# Patient Record
Sex: Female | Born: 1990 | Race: White | Hispanic: No | Smoking: Never smoker
Health system: Southern US, Community
[De-identification: ages and names within clinical notes are randomized; demographics above are authoritative.]

## PROBLEM LIST (undated history)

## (undated) DIAGNOSIS — J353 Hypertrophy of tonsils with hypertrophy of adenoids: Secondary | ICD-10-CM

## (undated) DIAGNOSIS — R29898 Other symptoms and signs involving the musculoskeletal system: Secondary | ICD-10-CM

## (undated) DIAGNOSIS — K8020 Calculus of gallbladder without cholecystitis without obstruction: Secondary | ICD-10-CM

---

## 2012-05-20 ENCOUNTER — Encounter (HOSPITAL_COMMUNITY): Payer: Self-pay | Admitting: *Deleted

## 2012-05-20 ENCOUNTER — Emergency Department (HOSPITAL_COMMUNITY)
Admission: EM | Admit: 2012-05-20 | Discharge: 2012-05-20 | Disposition: A | Discharge status: Home / Self Care | Payer: Federal, State, Local not specified - PPO | Attending: Emergency Medicine | Admitting: Emergency Medicine

## 2012-05-20 ENCOUNTER — Emergency Department (HOSPITAL_COMMUNITY): Payer: Federal, State, Local not specified - PPO

## 2012-05-20 DIAGNOSIS — S4980XA Other specified injuries of shoulder and upper arm, unspecified arm, initial encounter: Secondary | ICD-10-CM | POA: Insufficient documentation

## 2012-05-20 DIAGNOSIS — S46909A Unspecified injury of unspecified muscle, fascia and tendon at shoulder and upper arm level, unspecified arm, initial encounter: Secondary | ICD-10-CM | POA: Insufficient documentation

## 2012-05-20 DIAGNOSIS — Z79899 Other long term (current) drug therapy: Secondary | ICD-10-CM | POA: Insufficient documentation

## 2012-05-20 DIAGNOSIS — T07XXXA Unspecified multiple injuries, initial encounter: Secondary | ICD-10-CM

## 2012-05-20 DIAGNOSIS — Y939 Activity, unspecified: Secondary | ICD-10-CM | POA: Insufficient documentation

## 2012-05-20 DIAGNOSIS — Y92410 Unspecified street and highway as the place of occurrence of the external cause: Secondary | ICD-10-CM | POA: Insufficient documentation

## 2012-05-20 DIAGNOSIS — S298XXA Other specified injuries of thorax, initial encounter: Secondary | ICD-10-CM | POA: Insufficient documentation

## 2012-05-20 MED ORDER — IBUPROFEN 800 MG PO TABS
800.0000 mg | ORAL_TABLET | Freq: Once | ORAL | Status: AC
  Administered 2012-05-20: 800 mg via ORAL
  Filled 2012-05-20: qty 1

## 2012-05-20 NOTE — ED Provider Notes (Signed)
Medical screening examination/treatment/procedure(s) were performed by non-physician practitioner and as supervising physician I was immediately available for consultation/collaboration.  Commodore Bellew, MD 05/20/12 2053 

## 2012-05-20 NOTE — ED Notes (Signed)
Patient with no complaints at this time. Respirations even and unlabored. Skin warm/dry. Discharge instructions reviewed with patient at this time. Patient given opportunity to voice concerns/ask questions. Patient discharged at this time and left Emergency Department with steady gait.   
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
## 2012-09-04 ENCOUNTER — Encounter: Payer: Self-pay | Admitting: *Deleted

## 2012-09-05 ENCOUNTER — Ambulatory Visit (INDEPENDENT_AMBULATORY_CARE_PROVIDER_SITE_OTHER): Payer: BC Managed Care – PPO | Admitting: Family Medicine

## 2012-09-05 ENCOUNTER — Encounter: Payer: Self-pay | Admitting: Family Medicine

## 2012-09-05 VITALS — BP 111/70 | Temp 98.5°F | Wt 131.6 lb

## 2012-09-05 DIAGNOSIS — J019 Acute sinusitis, unspecified: Secondary | ICD-10-CM

## 2012-09-05 MED ORDER — AZITHROMYCIN 250 MG PO TABS: ORAL_TABLET | ORAL | Status: DC

## 2012-09-05 NOTE — Patient Instructions (Signed)
Loratadine 10 mg (generic of Claritin)

## 2012-09-05 NOTE — Progress Notes (Signed)
  Subjective:    Patient ID: Shannon Cole, female    DOB: 05-17-1990, 22 y.o.   MRN: [id number]  HPI Allergies-patient's had allergy symptoms over the past several days head congestion drainage sneezing watery eyes sometimes itching. No wheezing or difficulty breathing. sorethroat-patient relates sore throat over the past several days progressing into sinus pressure and discomfort. Still with some sore throat low-grade fever some chills. Mild sinus headache. Sinus pressure-moderate sinus pressure with allergies using Tylenol for this seems to help a little bit.  Past medical history benign   Review of Systems See above    Objective:   Physical Exam No acute distress, eardrums normal, throat minimal redness, neck is supple some lymph nodes on left side sinus minimal tenderness lungs are clear       Assessment & Plan:  Allergies-loratadine daily Sinusitis acute-Zithromax 5 days, if progressive symptoms or worse call or

## 2012-10-17 ENCOUNTER — Ambulatory Visit (INDEPENDENT_AMBULATORY_CARE_PROVIDER_SITE_OTHER): Payer: BC Managed Care – PPO | Admitting: Family Medicine

## 2012-10-17 ENCOUNTER — Encounter: Payer: Self-pay | Admitting: Family Medicine

## 2012-10-17 VITALS — BP 110/69 | Temp 98.3°F | Wt 133.0 lb

## 2012-10-17 DIAGNOSIS — J029 Acute pharyngitis, unspecified: Secondary | ICD-10-CM

## 2012-10-17 DIAGNOSIS — I889 Nonspecific lymphadenitis, unspecified: Secondary | ICD-10-CM

## 2012-10-17 MED ORDER — CEFPROZIL 500 MG PO TABS: 500.0000 mg | ORAL_TABLET | Freq: Two times a day (BID) | ORAL | Status: DC

## 2012-10-17 NOTE — Progress Notes (Signed)
  Subjective:    Patient ID: Shannon Cole, female    DOB: [birth year], 22 y.o.   MRN: [id number]  Sore Throat  This is a new problem. The current episode started in the past 7 days. The problem has been gradually worsening. The pain is worse on the right side. There has been no fever. The pain is at a severity of 7/10. The pain is moderate. Pertinent negatives include no diarrhea, headaches or hoarse voice. She has tried NSAIDs for the symptoms. The treatment provided no relief.   Increased frequency of throat infections lately   Review of Systems  HENT: Negative for hoarse voice.   Gastrointestinal: Negative for diarrhea.  Neurological: Negative for headaches.       Objective:   Physical Exam  Alert good hydration. HEENT pharynx somewhat erythematous neck tender anterior nodes enlarged right side neck supple lungs clear heart regular in rhythm.  Negative strep screen   No results found for this or any previous visit.  Assessment & Plan:  Impression pharyngitis with lymphadenitis. Discussed. Patient very concerned about recurrent infections. Will pursue ENT consult. WSL

## 2012-10-18 LAB — STREP A DNA PROBE: GASP: NEGATIVE

## 2013-02-12 ENCOUNTER — Encounter (INDEPENDENT_AMBULATORY_CARE_PROVIDER_SITE_OTHER): Payer: Self-pay

## 2013-02-12 ENCOUNTER — Ambulatory Visit (INDEPENDENT_AMBULATORY_CARE_PROVIDER_SITE_OTHER): Payer: BC Managed Care – PPO | Admitting: Adult Health

## 2013-02-12 ENCOUNTER — Encounter: Payer: Self-pay | Admitting: Adult Health

## 2013-02-12 VITALS — BP 110/60 | Ht 64.0 in | Wt 134.0 lb

## 2013-02-12 DIAGNOSIS — Z309 Encounter for contraceptive management, unspecified: Secondary | ICD-10-CM | POA: Insufficient documentation

## 2013-02-12 DIAGNOSIS — Z1212 Encounter for screening for malignant neoplasm of rectum: Secondary | ICD-10-CM

## 2013-02-12 DIAGNOSIS — Z01419 Encounter for gynecological examination (general) (routine) without abnormal findings: Secondary | ICD-10-CM

## 2013-02-12 DIAGNOSIS — N898 Other specified noninflammatory disorders of vagina: Secondary | ICD-10-CM

## 2013-02-12 LAB — POCT WET PREP (WET MOUNT)
Trichomonas Wet Prep HPF POC: NEGATIVE
WBC, Wet Prep HPF POC: NEGATIVE

## 2013-02-12 MED ORDER — LEVONORGEST-ETH ESTRAD 91-DAY 0.15-0.03 &0.01 MG PO TABS: 1.0000 | ORAL_TABLET | Freq: Every day | ORAL | Status: DC

## 2013-02-12 NOTE — Patient Instructions (Signed)
Follow up in 1 year for pap and physical  Call prn 

## 2013-02-12 NOTE — Progress Notes (Signed)
Patient ID: Shannon Cole, female   DOB: 12/20/1990, 22 y.o.   MRN: [id number] History of Present Illness: Shannon Cole is a 22 year old white female single in for physical and complains of discharge after period.   Current Medications, Allergies, Past Medical History, Past Surgical History, Family History and Social History were reviewed in [employer] record.     Review of Systems: Patient denies any headaches, blurred vision, shortness of breath, chest pain, abdominal pain, problems with bowel movements, urination, or intercourse. No joint pains or mood swings, happy with her pills, but has discharge after period.    Physical Exam:BP 110/60  Ht 5\' 4"  (1.626 m)  Wt 134 lb (60.782 kg)  BMI 22.99 kg/m2  LMP 02/03/2013 General:  Well developed, well nourished, no acute distress Skin:  Warm and dry Neck:  Midline trachea, normal thyroid Lungs; Clear to auscultation bilaterally Breast:  No dominant palpable mass, retraction, or nipple discharge Cardiovascular: Regular rate and rhythm Abdomen:  Soft, non tender, no hepatosplenomegaly Pelvic:  External genitalia is normal in appearance.  The vagina is normal in appearance,has white discharge no odor. The cervix is smooth and tiny.  Uterus is felt to be normal size, shape, and contour.  No adnexal masses or tenderness noted. Wet prep was negative Extremities:  No swelling or varicosities noted Psych:  No mood changes, alert and cooperative   Impression: Yearly exam no pap, will pap at 23 Contraceptive management Vaginal discharge    Plan: Refilled seasonique x 1 year Return in 1 year for pap and physical

## 2013-04-11 ENCOUNTER — Telehealth: Payer: Self-pay | Admitting: *Deleted

## 2013-04-11 NOTE — Telephone Encounter (Signed)
Pt missed taking birth control pills on Sunday and Monday of this past week, is bleeding (using tampons) and cramping. Per Cyril Mourning, NP pt to continue birth control and use condoms. Pt to call our office back as needed. Pt verbalized understanding.

## 2013-07-03 ENCOUNTER — Emergency Department (HOSPITAL_COMMUNITY)
Admission: EM | Admit: 2013-07-03 | Discharge: 2013-07-03 | Disposition: A | Discharge status: Home / Self Care | Payer: Federal, State, Local not specified - PPO | Attending: Emergency Medicine | Admitting: Emergency Medicine

## 2013-07-03 ENCOUNTER — Encounter (HOSPITAL_COMMUNITY): Payer: Self-pay | Admitting: Emergency Medicine

## 2013-07-03 DIAGNOSIS — R5381 Other malaise: Secondary | ICD-10-CM | POA: Insufficient documentation

## 2013-07-03 DIAGNOSIS — Z3202 Encounter for pregnancy test, result negative: Secondary | ICD-10-CM | POA: Insufficient documentation

## 2013-07-03 DIAGNOSIS — Z79899 Other long term (current) drug therapy: Secondary | ICD-10-CM | POA: Insufficient documentation

## 2013-07-03 DIAGNOSIS — R5383 Other fatigue: Secondary | ICD-10-CM

## 2013-07-03 DIAGNOSIS — A088 Other specified intestinal infections: Secondary | ICD-10-CM | POA: Insufficient documentation

## 2013-07-03 DIAGNOSIS — A084 Viral intestinal infection, unspecified: Secondary | ICD-10-CM

## 2013-07-03 LAB — CBC WITH DIFFERENTIAL/PLATELET
BASOS PCT: 0 % (ref 0–1)
Basophils Absolute: 0 10*3/uL (ref 0.0–0.1)
EOS ABS: 0 10*3/uL (ref 0.0–0.7)
Eosinophils Relative: 0 % (ref 0–5)
HCT: 40 % (ref 36.0–46.0)
HEMOGLOBIN: 13.7 g/dL (ref 12.0–15.0)
Lymphocytes Relative: 11 % — ABNORMAL LOW (ref 12–46)
Lymphs Abs: 1.2 10*3/uL (ref 0.7–4.0)
MCH: 29.3 pg (ref 26.0–34.0)
MCHC: 34.3 g/dL (ref 30.0–36.0)
MCV: 85.5 fL (ref 78.0–100.0)
MONOS PCT: 7 % (ref 3–12)
Monocytes Absolute: 0.7 10*3/uL (ref 0.1–1.0)
NEUTROS PCT: 82 % — AB (ref 43–77)
Neutro Abs: 9 10*3/uL — ABNORMAL HIGH (ref 1.7–7.7)
PLATELETS: 271 10*3/uL (ref 150–400)
RBC: 4.68 MIL/uL (ref 3.87–5.11)
RDW: 12.4 % (ref 11.5–15.5)
WBC: 10.9 10*3/uL — ABNORMAL HIGH (ref 4.0–10.5)

## 2013-07-03 LAB — URINALYSIS, ROUTINE W REFLEX MICROSCOPIC
Bilirubin Urine: NEGATIVE
GLUCOSE, UA: NEGATIVE mg/dL
Hgb urine dipstick: NEGATIVE
KETONES UR: 15 mg/dL — AB
LEUKOCYTES UA: NEGATIVE
Nitrite: NEGATIVE
PROTEIN: 30 mg/dL — AB
Specific Gravity, Urine: >1.03 — ABNORMAL HIGH (ref 1.005–1.030)
Urobilinogen, UA: 0.2 mg/dL (ref 0.0–1.0)
pH: 5.5 (ref 5.0–8.0)

## 2013-07-03 LAB — COMPREHENSIVE METABOLIC PANEL
ALBUMIN: 3.8 g/dL (ref 3.5–5.2)
ALT: 8 U/L (ref 0–35)
AST: 13 U/L (ref 0–37)
Alkaline Phosphatase: 64 U/L (ref 39–117)
BILIRUBIN TOTAL: 0.4 mg/dL (ref 0.3–1.2)
BUN: 10 mg/dL (ref 6–23)
CHLORIDE: 100 meq/L (ref 96–112)
CO2: 22 meq/L (ref 19–32)
CREATININE: 0.81 mg/dL (ref 0.50–1.10)
Calcium: 8.8 mg/dL (ref 8.4–10.5)
Glucose, Bld: 111 mg/dL — ABNORMAL HIGH (ref 70–99)
Potassium: 3.3 mEq/L — ABNORMAL LOW (ref 3.7–5.3)
Sodium: 136 mEq/L — ABNORMAL LOW (ref 137–147)
Total Protein: 7.8 g/dL (ref 6.0–8.3)

## 2013-07-03 LAB — PREGNANCY, URINE: Preg Test, Ur: NEGATIVE

## 2013-07-03 LAB — URINE MICROSCOPIC-ADD ON

## 2013-07-03 LAB — LIPASE, BLOOD: Lipase: 19 U/L (ref 11–59)

## 2013-07-03 MED ORDER — DICYCLOMINE HCL 10 MG PO CAPS
20.0000 mg | ORAL_CAPSULE | Freq: Once | ORAL | Status: AC
Start: 2013-07-03 — End: 2013-07-03
  Administered 2013-07-03: 20 mg via ORAL
  Filled 2013-07-03: qty 2

## 2013-07-03 MED ORDER — DICYCLOMINE HCL 20 MG PO TABS: 20.0000 mg | ORAL_TABLET | Freq: Two times a day (BID) | ORAL | Status: DC | PRN

## 2013-07-03 MED ORDER — SODIUM CHLORIDE 0.9 % IV BOLUS (SEPSIS)
1000.0000 mL | Freq: Once | INTRAVENOUS | Status: AC
  Administered 2013-07-03: 1000 mL via INTRAVENOUS

## 2013-07-03 MED ORDER — POTASSIUM CHLORIDE CRYS ER 20 MEQ PO TBCR
20.0000 meq | EXTENDED_RELEASE_TABLET | Freq: Once | ORAL | Status: AC
  Administered 2013-07-03: 20 meq via ORAL
  Filled 2013-07-03: qty 1

## 2013-07-03 MED ORDER — ACETAMINOPHEN 500 MG PO TABS
1000.0000 mg | ORAL_TABLET | Freq: Once | ORAL | Status: AC
  Administered 2013-07-03: 1000 mg via ORAL
  Filled 2013-07-03: qty 2

## 2013-07-03 MED ORDER — ONDANSETRON HCL 4 MG/2ML IJ SOLN
4.0000 mg | Freq: Once | INTRAMUSCULAR | Status: AC
  Administered 2013-07-03: 4 mg via INTRAVENOUS
  Filled 2013-07-03: qty 2

## 2013-07-03 MED ORDER — ONDANSETRON HCL 8 MG PO TABS: 8.0000 mg | ORAL_TABLET | Freq: Three times a day (TID) | ORAL | Status: DC | PRN

## 2013-07-03 NOTE — ED Provider Notes (Signed)
Medical screening examination/treatment/procedure(s) were conducted as a shared visit with non-physician practitioner(s) and myself.  I personally evaluated the patient during the encounter.   EKG Interpretation None     Patient is nontender over McBurney's point. She feels better after IV fluids  Shannon HutchingBrian Bartt Gonzaga, MD 07/03/13 1513

## 2013-07-03 NOTE — ED Notes (Signed)
abd pain with n.v.d since yesterday. Fever started last pm per pt 100.9. Sudden onset of severe upper abd pain this am.

## 2013-07-03 NOTE — ED Notes (Signed)
C/o headache. Leona SingletonJ. Idol PA aware.

## 2013-07-03 NOTE — ED Provider Notes (Signed)
CSN: [id number]     Arrival date & time 07/03/13  0747 History   First MD Initiated Contact with Patient 07/03/13 [phone]     Chief Complaint  Patient presents with  . Abdominal Pain  . Vomiting     (Consider location/radiation/quality/duration/timing/severity/associated sxs/prior Treatment) HPI Comments: Shannon Cole is a 23 y.o. Female  With nausea, vomiting and diarrhea along with fever to 100.9 which started yesterday.  She has had generalized abdominal cramping and discomfort until she woke around 4 am today with severe left upper abdominal pain and cramping.  She has not had diarrhea or vomiting today, although she is nauseated and currently has a moderate headache.  She denies hemetemesis and bloody diarrhea.  She has taken tylenol with no improvement in her symptoms.  She has had no po intake today but was able to tolerate a small amount of gingerale and saltine crackers last night before bed.  She denies chest pain, cough and sob, also denies hematuria or dysuria and has no lower abdominal or pelvic cramping.  She has generalized weakness.  Her father had similar symptoms last week.     The history is provided by the patient and a parent.    Past Medical History  Diagnosis Date  . Contraceptive management 02/12/2013   History reviewed. No pertinent past surgical history. Family History  Problem Relation Age of Onset  . Diabetes Mother   . Cancer Father     lung   History  Substance Use Topics  . Smoking status: Never Smoker   . Smokeless tobacco: Never Used  . Alcohol Use: No   OB History   Grav Para Term Preterm Abortions TAB SAB Ect Mult Living                 Review of Systems  Constitutional: Positive for fever and chills.  HENT: Negative for congestion, rhinorrhea and sore throat.   Eyes: Negative.   Respiratory: Negative for chest tightness and shortness of breath.   Cardiovascular: Negative for chest pain.  Gastrointestinal: Positive for nausea, vomiting,  abdominal pain and diarrhea.       Abdominal pain  Genitourinary: Negative.  Negative for dysuria.  Musculoskeletal: Negative for arthralgias, joint swelling and neck pain.  Skin: Negative.  Negative for rash and wound.  Neurological: Negative for dizziness, weakness, light-headedness, numbness and headaches.  Psychiatric/Behavioral: Negative.       Allergies  Review of patient's allergies indicates no known allergies.  Home Medications   Current Outpatient Rx  Name  Route  Sig  Dispense  Refill  . Levonorgestrel-Ethinyl Estradiol (SEASONIQUE) 0.15-0.03 &0.01 MG tablet   Oral   Take 1 tablet by mouth daily.   1 Package   4   . dicyclomine (BENTYL) 20 MG tablet   Oral   Take 1 tablet (20 mg total) by mouth 2 (two) times daily as needed for spasms.   20 tablet   0   . ondansetron (ZOFRAN) 8 MG tablet   Oral   Take 1 tablet (8 mg total) by mouth every 8 (eight) hours as needed for nausea or vomiting.   12 tablet   0    BP 107/62  Pulse 80  Temp(Src) 98.7 F (37.1 C)  Resp 18  Wt 135 lb (61.236 kg)  SpO2 100%  LMP 05/05/2013 Physical Exam  Nursing note and vitals reviewed. Constitutional: She appears well-developed and well-nourished.  HENT:  Head: Normocephalic and atraumatic.  Mouth/Throat: Mucous membranes are dry. No oropharyngeal  exudate or posterior oropharyngeal erythema.  Eyes: Conjunctivae are normal.  Neck: Normal range of motion.  Cardiovascular: Normal rate, regular rhythm, normal heart sounds and intact distal pulses.   Pulmonary/Chest: Effort normal and breath sounds normal. She has no wheezes.  Abdominal: Soft. Bowel sounds are normal. She exhibits no distension and no mass. There is tenderness in the left upper quadrant. There is no guarding.  Musculoskeletal: Normal range of motion.  Neurological: She is alert.  Skin: Skin is warm and dry.  Psychiatric: She has a normal mood and affect.    ED Course  Procedures (including critical care  time) Labs Review Labs Reviewed  CBC WITH DIFFERENTIAL - Abnormal; Notable for the following:    WBC 10.9 (*)    Neutrophils Relative % 82 (*)    Neutro Abs 9.0 (*)    Lymphocytes Relative 11 (*)    All other components within normal limits  URINALYSIS, ROUTINE W REFLEX MICROSCOPIC - Abnormal; Notable for the following:    Specific Gravity, Urine >1.030 (*)    Ketones, ur 15 (*)    Protein, ur 30 (*)    All other components within normal limits  COMPREHENSIVE METABOLIC PANEL - Abnormal; Notable for the following:    Sodium 136 (*)    Potassium 3.3 (*)    Glucose, Bld 111 (*)    All other components within normal limits  URINE MICROSCOPIC-ADD ON - Abnormal; Notable for the following:    Squamous Epithelial / LPF MANY (*)    Bacteria, UA MANY (*)    All other components within normal limits  URINE CULTURE  PREGNANCY, URINE  LIPASE, BLOOD   Imaging Review No results found.   EKG Interpretation None      MDM   Final diagnoses:  Viral gastroenteritis    Pt was given 2 L of normal saline and she also tolerated by mouth fluids and felt better, less weak after receiving these fluids.  She was given replacement of her potassium, given Zofran with resolution of nausea and Bentyl which relieved her abdominal cramping.  She was encouraged to rest, make sure she is drinking plenty of fluids and was given prescriptions for Zofran and Bentyl for home use if her symptoms return.  Discussed with patient the slight risk of early appendicitis and to get rechecked if her pain persists or migrates towards her right lower quadrant.  The patient appears reasonably screened and/or stabilized for discharge and I doubt any other medical condition or other [facility] requiring further screening, evaluation, or treatment in the ED at this time prior to discharge.     Burgess AmorJulie Amayrany Cafaro, PA-C 07/03/13 1110  Burgess AmorJulie Iker Nuttall, PA-C 07/03/13 [phone]

## 2013-07-03 NOTE — Discharge Instructions (Signed)
Viral Gastroenteritis Viral gastroenteritis is also known as stomach flu. This condition affects the stomach and intestinal tract. It can cause sudden diarrhea and vomiting. The illness typically lasts 3 to 8 days. Most people develop an immune response that eventually gets rid of the virus. While this natural response develops, the virus can make you quite ill. CAUSES  Many different viruses can cause gastroenteritis, such as rotavirus or noroviruses. You can catch one of these viruses by consuming contaminated food or water. You may also catch a virus by sharing utensils or other personal items with an infected person or by touching a contaminated surface. SYMPTOMS  The most common symptoms are diarrhea and vomiting. These problems can cause a severe loss of body fluids (dehydration) and a body salt (electrolyte) imbalance. Other symptoms may include:  Fever.  Headache.  Fatigue.  Abdominal pain. DIAGNOSIS  Your caregiver can usually diagnose viral gastroenteritis based on your symptoms and a physical exam. A stool sample may also be taken to test for the presence of viruses or other infections. TREATMENT  This illness typically goes away on its own. Treatments are aimed at rehydration. The most serious cases of viral gastroenteritis involve vomiting so severely that you are not able to keep fluids down. In these cases, fluids must be given through an intravenous line (IV). HOME CARE INSTRUCTIONS   Drink enough fluids to keep your urine clear or pale yellow. Drink small amounts of fluids frequently and increase the amounts as tolerated.  Ask your caregiver for specific rehydration instructions.  Avoid:  Foods high in sugar.  Alcohol.  Carbonated drinks.  Tobacco.  Juice.  Caffeine drinks.  Extremely hot or cold fluids.  Fatty, greasy foods.  Too much intake of anything at one time.  Dairy products until 24 to 48 hours after diarrhea stops.  You may consume probiotics.  Probiotics are active cultures of beneficial bacteria. They may lessen the amount and number of diarrheal stools in adults. Probiotics can be found in yogurt with active cultures and in supplements.  Wash your hands well to avoid spreading the virus.  Only take over-the-counter or prescription medicines for pain, discomfort, or fever as directed by your caregiver. Do not give aspirin to children. Antidiarrheal medicines are not recommended.  Ask your caregiver if you should continue to take your regular prescribed and over-the-counter medicines.  Keep all follow-up appointments as directed by your caregiver. SEEK IMMEDIATE MEDICAL CARE IF:   You are unable to keep fluids down.  You do not urinate at least once every 6 to 8 hours.  You develop shortness of breath.  You notice blood in your stool or vomit. This may look like coffee grounds.  You have abdominal pain that increases or is concentrated in one small area (localized).  You have persistent vomiting or diarrhea.  You have a fever.  The patient is a child younger than 3 months, and he or she has a fever.  The patient is a child older than 3 months, and he or she has a fever and persistent symptoms.  The patient is a child older than 3 months, and he or she has a fever and symptoms suddenly get worse.  The patient is a baby, and he or she has no tears when crying. MAKE SURE YOU:   Understand these instructions.  Will watch your condition.  Will get help right away if you are not doing well or get worse. Document Released: 04/18/2005 Document Revised: 07/11/2011 Document Reviewed: 02/02/2011   [employer] Patient Information 2014 [employer]  

## 2013-07-03 NOTE — ED Notes (Signed)
nad noted prior to dc. Dc instructions reviewed with pt. 2 scripts given to pt.

## 2013-07-04 LAB — URINE CULTURE: Colony Count: 50000

## 2013-12-19 ENCOUNTER — Ambulatory Visit (INDEPENDENT_AMBULATORY_CARE_PROVIDER_SITE_OTHER): Payer: BC Managed Care – PPO | Admitting: Family Medicine

## 2013-12-19 VITALS — BP 104/62 | HR 71 | Temp 97.9°F | Resp 16 | Ht 64.0 in | Wt 132.6 lb

## 2013-12-19 DIAGNOSIS — L03115 Cellulitis of right lower limb: Secondary | ICD-10-CM

## 2013-12-19 DIAGNOSIS — L03119 Cellulitis of unspecified part of limb: Secondary | ICD-10-CM

## 2013-12-19 DIAGNOSIS — L02419 Cutaneous abscess of limb, unspecified: Secondary | ICD-10-CM

## 2013-12-19 MED ORDER — DOXYCYCLINE HYCLATE 100 MG PO CAPS: 100.0000 mg | ORAL_CAPSULE | Freq: Two times a day (BID) | ORAL | Status: DC

## 2013-12-19 NOTE — Progress Notes (Signed)
   Subjective:    Patient ID: Shannon Cole, female    DOB: 05/02/1990, 23 y.o.   MRN: [id number]  HPI Shannon LeatherwoodKatherine is a 23 year old female who presents with a bug bite today. Onset was 3 days ago. Location is the right anterior shin. She cannot recall witnessing a specific bug bite. She says that the initial lites started out as a small approximately half centimeter we'll with mild erythema that was itchy. As she continued to itch this, today it became increasingly red and the surrounding area with slight tenderness. She has not tried any relieving factors. No recent camping, tick exposure, going into the grass or woods.  She states she is currently taking her OCPs diligently, and does not miss any doses. Allergies: NKDA  History   Social History  . Marital Status: Single    Spouse Name: N/A    Number of Children: N/A  . Years of Education: N/A   Occupational History  . Not on file.   Social History Main Topics  . Smoking status: Never Smoker   . Smokeless tobacco: Never Used  . Alcohol Use: No  . Drug Use: No  . Sexual Activity: Yes    Birth Control/ Protection: Pill   Other Topics Concern  . Not on file   Social History Narrative  . No narrative on file    Review of Systems Pertinent Postive: Rash, itching Pertinent negative: fevers, chills, nausea, vomiting, malaise.    Objective:   Physical Exam BP 104/62  Pulse 71  Temp(Src) 97.9 F (36.6 C) (Oral)  Resp 16  Ht 5\' 4"  (1.626 m)  Wt 132 lb 9.6 oz (60.147 kg)  BMI 22.75 kg/m2  SpO2 100%  LMP 11/18/2013 General appearance: alert, cooperative and appears stated age Skin: Skin color, texture, turgor normal. No rashes or lesions or 3x3 circular macular area of erythema at the right anterior shin, with 5 mm central area of induration without fluctuance. Small punctate lesion seen at the Center for this. The surrounding erythema is salmon colored, not beefy red. No central clearing of the rash. Lymph nodes: Cervical,  supraclavicular, and axillary nodes normal. and No popliteal or inguinal LAD      Assessment & Plan:  1. Cellulitis, most likely from bug bite with resulting itching. Differential diagnosis also includes, but is less likely Lyme's disease arising onset fever, given the lack of systemic symptoms, or characteristic erythema migrans. No other MRSA risk factors.  -Regardless, we will start her on doxycycline 100 mg twice a day x10 days for the cellulitis.  -I cautioned her about the teratogenic effects of doxycycline on a newborn fetuses. She again reinforced that she is compliant with her oral contraceptive, Seasonique. Instructed her to use a backup form of birth control as well, such as condom use for abstinence while she is on doxycycline. She verbalized understanding and agreement. Rx sent to pharmacy. -For itching, I recommended hydrocortisone 1% cream as needed to the area. -I cautioned her about sun exposure while taking doxycycline, with the risk of photosensitive rash. She verbalized understanding and agreement. -He will followup as needed. If she develops any worsening rash, fevers, chills, malaise, nausea, vomiting, or for any other concerns she should follow up with her clinic or go to the emergency department.  Dr. Tora KindredJohn Pick-Jacobs, DO Sports Medicine Fellow  Discussed with Dr. Katrinka BlazingSmith, M.D.

## 2013-12-19 NOTE — Patient Instructions (Addendum)

## 2013-12-20 NOTE — Addendum Note (Signed)
Addended by: Ethelda ChickSMITH, Jaskirat Schwieger M on: 12/20/2013 09:33 AM   Modules accepted: Level of Service

## 2013-12-20 NOTE — Progress Notes (Signed)
History and physical examinations reviewed in detail with Dr. Joellyn HaffPick-Jacobs.  Agree with assessment and plan.
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
## 2014-02-25 ENCOUNTER — Ambulatory Visit (INDEPENDENT_AMBULATORY_CARE_PROVIDER_SITE_OTHER): Payer: BC Managed Care – PPO | Admitting: Physician Assistant

## 2014-02-25 VITALS — BP 115/71 | HR 103 | Temp 100.4°F | Resp 20 | Ht 65.0 in | Wt 132.2 lb

## 2014-02-25 DIAGNOSIS — J029 Acute pharyngitis, unspecified: Secondary | ICD-10-CM

## 2014-02-25 DIAGNOSIS — J0391 Acute recurrent tonsillitis, unspecified: Secondary | ICD-10-CM

## 2014-02-25 LAB — POCT RAPID STREP A (OFFICE): Rapid Strep A Screen: NEGATIVE

## 2014-02-25 MED ORDER — AMOXICILLIN-POT CLAVULANATE 875-125 MG PO TABS: 1.0000 | ORAL_TABLET | Freq: Two times a day (BID) | ORAL | Status: DC

## 2014-02-25 NOTE — Progress Notes (Signed)
   Subjective:    Patient ID: Shannon Cole, female    DOB: 03/20/1991, 23 y.o.   MRN: [id number]  HPI  This is a 23 year old female with no significant PMH here with 3 days of sore throat and lymphadenopathy. She has one painful swollen node on the right side of her neck. She denies associated symptoms - no nasal congestion, otalgia, or cough. She is febrile today. She tried tylenol last night with some relief. She reports she has gotten sick once a month for many years and always gets a sore throat with her illnesses. She wants her tonsils removed.  Review of Systems  Constitutional: Positive for fever. Negative for chills.  HENT: Positive for sore throat. Negative for congestion, ear pain, sinus pressure and trouble swallowing.   Eyes: Negative for discharge and redness.  Respiratory: Negative for cough.   Gastrointestinal: Negative for abdominal pain.  Skin: Negative for rash.       Objective:   Physical Exam  Constitutional: She is oriented to person, place, and time. She appears well-developed and well-nourished. No distress.  HENT:  Head: Normocephalic and atraumatic.  Right Ear: Hearing, tympanic membrane, external ear and ear canal normal.  Left Ear: Hearing, tympanic membrane, external ear and ear canal normal.  Nose: Nose normal.  Mouth/Throat: Uvula is midline and mucous membranes are normal. No uvula swelling. Oropharyngeal exudate and posterior oropharyngeal erythema present. No tonsillar abscesses.  Eyes: Conjunctivae are normal. Right eye exhibits no discharge. Left eye exhibits no discharge. No scleral icterus.  Lymphadenopathy:    She has cervical adenopathy.       Right cervical: Superficial cervical (2cm node, tender) adenopathy present.  Neurological: She is alert and oriented to person, place, and time.  Skin: Skin is warm, dry and intact. No lesion and no rash noted.  Psychiatric: She has a normal mood and affect. Her speech is normal and behavior is normal.  Thought content normal.   Results for orders placed in visit on 02/25/14  POCT RAPID STREP A (OFFICE)      Result Value Ref Range   Rapid Strep A Screen Negative  Negative      Assessment & Plan:  1. Sore throat 2. Acute pharyngitis, unspecified pharyngitis type 3. Recurrent tonsillitis  Rapid strep was negative, however chronic infection is likely d/t recurrent pharyngitis. Will treat empirically with Augmentin. Culture pending. Made a referral to ENT to discuss tonsillectomy.  - POCT rapid strep A - Culture, Group A Strep - amoxicillin-clavulanate (AUGMENTIN) 875-125 MG per tablet; Take 1 tablet by mouth 2 (two) times daily.  Dispense: 20 tablet; Refill: 0 - Ambulatory referral to ENT   Roswell MinersNicole V. Dyke BrackettBush, PA-C, MHS [facility]  02/25/2014

## 2014-02-25 NOTE — Patient Instructions (Signed)
You will be contacted to make an appt. With ENT. Take augmentin twice a day for 10 days. Return to clinic in 7-10 days if not improved.

## 2014-02-27 ENCOUNTER — Telehealth: Payer: Self-pay | Admitting: Physician Assistant

## 2014-02-27 ENCOUNTER — Telehealth: Payer: Self-pay

## 2014-02-27 DIAGNOSIS — J029 Acute pharyngitis, unspecified: Secondary | ICD-10-CM

## 2014-02-27 MED ORDER — AZITHROMYCIN 250 MG PO TABS: ORAL_TABLET | ORAL | Status: DC

## 2014-02-27 NOTE — Telephone Encounter (Signed)
Patient thinks she is having a reaction to Medication.  She wants to talk with Joni ReiningNicole.  Please have Joni Reiningicole call patient when she comes in at 3 pm.    [phone]

## 2014-02-27 NOTE — Telephone Encounter (Signed)
Talked with patient. She woke this morning with 5-6 red, pruritic tender spots on her forearms. She is not having any oral edema or difficulty breathing. She only took two pills yesterday and none today. She cannot recall an allergy to amoxicillin or augmentin before. She reports her PCP usually gives her zithromax for her sore throat. She will stop the augmentin and start zithromax. She will take benadryl for the rash. She will call with any further concerns.

## 2014-02-27 NOTE — Telephone Encounter (Signed)
Spoke to pt- she called her PCP she needed an answer right away. She has stopped taking abx. Please advise if a new medication needs to be ordered. She has broke out in hives. She is itchy and the red areas hurt. Advised pt to take some benadryl. Denies any other symptoms. No SOB or difficulty breathing.

## 2014-02-28 ENCOUNTER — Encounter: Payer: Self-pay | Admitting: Nurse Practitioner

## 2014-02-28 ENCOUNTER — Ambulatory Visit (INDEPENDENT_AMBULATORY_CARE_PROVIDER_SITE_OTHER): Payer: BC Managed Care – PPO | Admitting: Nurse Practitioner

## 2014-02-28 VITALS — BP 122/72 | Temp 98.4°F | Ht 65.0 in | Wt 132.4 lb

## 2014-02-28 DIAGNOSIS — J029 Acute pharyngitis, unspecified: Secondary | ICD-10-CM

## 2014-02-28 DIAGNOSIS — J0391 Acute recurrent tonsillitis, unspecified: Secondary | ICD-10-CM

## 2014-02-28 LAB — CULTURE, GROUP A STREP: ORGANISM ID, BACTERIA: NORMAL

## 2014-02-28 NOTE — Progress Notes (Signed)
I was directly involved with the patient's care and agree with the physical, diagnosis and treatment plan.  

## 2014-03-05 ENCOUNTER — Encounter: Payer: Self-pay | Admitting: Nurse Practitioner

## 2014-03-05 NOTE — Progress Notes (Signed)
Subjective:  Presents for recheck on her sore throat. Was seen in urgent care on 10/27 for acute pharyngitis and recurrent tonsillitis. Rapid strep was negative. Started on Augmentin. Recommended referral to ENT specialist for evaluation.she had a rash, on her forearms after starting Augmentin, she was advised by PA to stop medication and switched to Zithromax. Having sore throat about once a month. Denies any snoring or difficulty swallowing. Sore throat is slightly better at this point. No fever. Continues to have a swollen lymph node on the right side. No further rash.  Objective:   BP 122/72 mmHg  Temp(Src) 98.4 F (36.9 C) (Oral)  Ht 5\' 5"  (1.651 m)  Wt 132 lb 6 oz (60.045 kg)  BMI 22.03 kg/m2  LMP 02/03/2014 NAD. Alert, oriented. TMs mild clear effusion, no erythema. Pharynx minimal erythema on the posterior soft palate, left tonsil normal, right tonsil 1+ with no erythema or exudate noted. Neck supple with moderate soft tender right anterior cervical adenopathy. Lungs clear. Heart regular rate rhythm. Abdomen soft nondistended nontender.  Assessment: Acute pharyngitis, unspecified pharyngitis type - Plan: CBC with Differential, Mononucleosis screen  Plan: complete Z-Pak as directed. Labs ordered as a precaution due to persistent sore throat. Warning signs reviewed. Will refer to ENT specialist per patient request. Call back sooner if no improvement.

## 2014-03-25 ENCOUNTER — Other Ambulatory Visit: Payer: BC Managed Care – PPO | Admitting: Adult Health

## 2014-04-22 ENCOUNTER — Encounter: Payer: Self-pay | Admitting: Nurse Practitioner

## 2014-04-22 ENCOUNTER — Ambulatory Visit (INDEPENDENT_AMBULATORY_CARE_PROVIDER_SITE_OTHER): Payer: BC Managed Care – PPO | Admitting: Nurse Practitioner

## 2014-04-22 ENCOUNTER — Encounter: Payer: Self-pay | Admitting: Family Medicine

## 2014-04-22 VITALS — Temp 98.5°F | Ht 65.0 in | Wt 133.0 lb

## 2014-04-22 DIAGNOSIS — A084 Viral intestinal infection, unspecified: Secondary | ICD-10-CM

## 2014-04-22 MED ORDER — ONDANSETRON 8 MG PO TBDP: 8.0000 mg | ORAL_TABLET | Freq: Three times a day (TID) | ORAL | Status: DC | PRN

## 2014-04-26 ENCOUNTER — Encounter: Payer: Self-pay | Admitting: Nurse Practitioner

## 2014-04-26 NOTE — Progress Notes (Signed)
Subjective: Presents for complaints of nausea vomiting and diarrhea that began 3 days ago. Vomiting has stopped, none today. Slight nausea. 3 episodes of diarrhea today. No fever. Mild abdominal pain. Taking clear liquids well. No urinary symptoms.  Objective:   Temp(Src) 98.5 F (36.9 C)  Ht 5\' 5"  (1.651 m)  Wt 133 lb (60.328 kg)  BMI 22.13 kg/m2 NAD. Alert, oriented. TMs normal limit. Pharynx clear moist. Neck supple with minimal adenopathy. Lungs clear. Heart regular rate rhythm. Abdomen soft nondistended with active bowel sounds; mild epigastric area tenderness, no rebound or guarding.  Assessment: Viral gastroenteritis  Plan:  Meds ordered this encounter  Medications  . ondansetron (ZOFRAN-ODT) 8 MG disintegrating tablet    Sig: Take 1 tablet (8 mg total) by mouth every 8 (eight) hours as needed for nausea or vomiting.    Dispense:  20 tablet    Refill:  0    Order Specific Question:  Supervising Provider    Answer:  Merlyn AlbertLUKING, WILLIAM S [[id number]]   Reviewed symptomatic care dietary measures and warning signs. Call back in a.m. if no improvement, go to ED sooner if worse.

## 2014-05-01 ENCOUNTER — Other Ambulatory Visit: Payer: Self-pay | Admitting: Adult Health

## 2014-05-06 ENCOUNTER — Encounter: Payer: Self-pay | Admitting: Adult Health

## 2014-05-06 ENCOUNTER — Other Ambulatory Visit (HOSPITAL_COMMUNITY)
Admission: RE | Admit: 2014-05-06 | Discharge: 2014-05-06 | Disposition: A | Discharge status: Home / Self Care | Payer: Federal, State, Local not specified - PPO | Source: Ambulatory Visit | Attending: Obstetrics and Gynecology | Admitting: Obstetrics and Gynecology

## 2014-05-06 ENCOUNTER — Ambulatory Visit (INDEPENDENT_AMBULATORY_CARE_PROVIDER_SITE_OTHER): Payer: BLUE CROSS/BLUE SHIELD | Admitting: Adult Health

## 2014-05-06 VITALS — BP 110/60 | HR 74 | Ht 64.0 in | Wt 131.5 lb

## 2014-05-06 DIAGNOSIS — Z01419 Encounter for gynecological examination (general) (routine) without abnormal findings: Secondary | ICD-10-CM | POA: Diagnosis not present

## 2014-05-06 DIAGNOSIS — Z3041 Encounter for surveillance of contraceptive pills: Secondary | ICD-10-CM

## 2014-05-06 MED ORDER — LEVONORGEST-ETH ESTRAD 91-DAY 0.15-0.03 &0.01 MG PO TABS
1.0000 | ORAL_TABLET | Freq: Every day | ORAL | Status: DC
Start: 2014-05-06 — End: 2015-07-31

## 2014-05-06 NOTE — Patient Instructions (Signed)
Physical in 1 year Pap in 2 years  

## 2014-05-06 NOTE — Progress Notes (Signed)
Patient ID: Shannon PittsKatherine Cole, female   DOB: 05/03/1990, 24 y.o.   MRN: [id number] History of Present Illness: Shannon Cole is a 24 year old white female in for pap and physical.She is on camrese but is thinking about changing methods, so she does not have to remember to take a pill every daily.No complaints.   Current Medications, Allergies, Past Medical History, Past Surgical History, Family History and Social History were reviewed in [facility] electronic [facility] record.     Review of Systems: Patient denies any headaches, blurred vision, shortness of breath, chest pain, abdominal pain, problems with bowel movements, urination, or intercourse. No joint pain or mood swings.    Physical Exam:BP 110/60 mmHg  Pulse 74  Ht 5\' 4"  (1.626 m)  Wt 131 lb 8 oz (59.648 kg)  BMI 22.56 kg/m2  LMP 05/06/2014 General:  Well developed, well nourished, no acute distress Skin:  Warm and dry Neck:  Midline trachea, normal thyroid Lungs; Clear to auscultation bilaterally Breast:  No dominant palpable mass, retraction, or nipple discharge Cardiovascular: Regular rate and rhythm Abdomen:  Soft, non tender, no hepatosplenomegaly Pelvic:  External genitalia is normal in appearance.  The vagina is normal in appearance, has some white discharge,some pink too, like period starting, no odor.  The cervix is tiny, pap peformed.  Uterus is felt to be normal size, shape, and contour.  No  adnexal masses or tenderness noted. Extremities:  No swelling or varicosities noted Psych:  No mood changes,alert and cooperative,seems happy Discussed nexplanon, skyla and nuva ring, will review all the handouts and think about it, will stay on OCs for now, told her to put with make up and set alarm on phone to help remind her to take the pill.  Impression: Well woman gyn exam with pap Contraceptive management   Plan: Refilled camrese x 1 year Review handouts on nexplanon,skyla and nuva ring,call if wants to try one of  these Physical in 1 year,pap in 2 if normal today

## 2014-05-07 LAB — CYTOLOGY - PAP

## 2014-07-25 ENCOUNTER — Encounter: Payer: Self-pay | Admitting: Family Medicine

## 2014-07-25 ENCOUNTER — Ambulatory Visit (INDEPENDENT_AMBULATORY_CARE_PROVIDER_SITE_OTHER): Payer: Federal, State, Local not specified - PPO | Admitting: Family Medicine

## 2014-07-25 VITALS — BP 102/70 | Temp 99.1°F | Ht 64.0 in | Wt 133.0 lb

## 2014-07-25 DIAGNOSIS — J019 Acute sinusitis, unspecified: Secondary | ICD-10-CM

## 2014-07-25 DIAGNOSIS — B9689 Other specified bacterial agents as the cause of diseases classified elsewhere: Secondary | ICD-10-CM

## 2014-07-25 MED ORDER — AZITHROMYCIN 250 MG PO TABS: ORAL_TABLET | ORAL | Status: DC

## 2014-07-25 NOTE — Progress Notes (Signed)
   Subjective:    Patient ID: Shannon PittsKatherine Cole, female    DOB: 03/30/1991, 24 y.o.   MRN: [id number]  Cough This is a new problem. Episode onset: 1 week ago. Associated symptoms include nasal congestion and rhinorrhea. Pertinent negatives include no chest pain, ear pain, fever, shortness of breath or wheezing. Treatments tried: coricidin, ibuprofen.   PMH benign   Review of Systems  Constitutional: Negative for fever and activity change.  HENT: Positive for congestion and rhinorrhea. Negative for ear pain.   Eyes: Negative for discharge.  Respiratory: Positive for cough. Negative for shortness of breath and wheezing.   Cardiovascular: Negative for chest pain.       Objective:   Physical Exam  Constitutional: She appears well-developed.  HENT:  Head: Normocephalic.  Nose: Nose normal.  Mouth/Throat: Oropharynx is clear and moist. No oropharyngeal exudate.  Neck: Neck supple.  Cardiovascular: Normal rate and normal heart sounds.   No murmur heard. Pulmonary/Chest: Effort normal and breath sounds normal. She has no wheezes.  Lymphadenopathy:    She has no cervical adenopathy.  Skin: Skin is warm and dry.  Nursing note and vitals reviewed.         Assessment & Plan:  Acute rhinosinusitis antibiotics prescribed warning signs discussed follow-up if ongoing troubles

## 2014-08-23 ENCOUNTER — Emergency Department (HOSPITAL_COMMUNITY)
Admission: EM | Admit: 2014-08-23 | Discharge: 2014-08-23 | Disposition: A | Discharge status: Home / Self Care | Payer: Federal, State, Local not specified - PPO | Attending: Emergency Medicine | Admitting: Emergency Medicine

## 2014-08-23 ENCOUNTER — Encounter (HOSPITAL_COMMUNITY): Payer: Self-pay | Admitting: Emergency Medicine

## 2014-08-23 DIAGNOSIS — J029 Acute pharyngitis, unspecified: Secondary | ICD-10-CM | POA: Diagnosis present

## 2014-08-23 DIAGNOSIS — Z79899 Other long term (current) drug therapy: Secondary | ICD-10-CM | POA: Diagnosis not present

## 2014-08-23 DIAGNOSIS — Z792 Long term (current) use of antibiotics: Secondary | ICD-10-CM | POA: Diagnosis not present

## 2014-08-23 DIAGNOSIS — J020 Streptococcal pharyngitis: Secondary | ICD-10-CM | POA: Insufficient documentation

## 2014-08-23 LAB — RAPID STREP SCREEN (MED CTR MEBANE ONLY): Streptococcus, Group A Screen (Direct): NEGATIVE

## 2014-08-23 MED ORDER — AZITHROMYCIN 250 MG PO TABS
500.0000 mg | ORAL_TABLET | Freq: Once | ORAL | Status: AC
  Administered 2014-08-23: 500 mg via ORAL
  Filled 2014-08-23: qty 2

## 2014-08-23 MED ORDER — AZITHROMYCIN 500 MG PO TABS: 500.0000 mg | ORAL_TABLET | Freq: Every day | ORAL | Status: DC

## 2014-08-23 MED ORDER — KETOROLAC TROMETHAMINE 30 MG/ML IJ SOLN
30.0000 mg | Freq: Once | INTRAMUSCULAR | Status: AC
  Administered 2014-08-23: 30 mg via INTRAVENOUS
  Filled 2014-08-23: qty 1

## 2014-08-23 MED ORDER — SODIUM CHLORIDE 0.9 % IV BOLUS (SEPSIS)
1000.0000 mL | INTRAVENOUS | Status: AC
  Administered 2014-08-23: 1000 mL via INTRAVENOUS

## 2014-08-23 MED ORDER — ACETAMINOPHEN 325 MG PO TABS
ORAL_TABLET | ORAL | Status: AC
  Filled 2014-08-23: qty 2

## 2014-08-23 MED ORDER — ONDANSETRON HCL 4 MG PO TABS: 4.0000 mg | ORAL_TABLET | Freq: Four times a day (QID) | ORAL | Status: DC

## 2014-08-23 MED ORDER — ONDANSETRON HCL 4 MG/2ML IJ SOLN
4.0000 mg | INTRAMUSCULAR | Status: AC
  Administered 2014-08-23: 4 mg via INTRAVENOUS
  Filled 2014-08-23: qty 2

## 2014-08-23 MED ORDER — HYDROCODONE-ACETAMINOPHEN 5-325 MG PO TABS: 1.0000 | ORAL_TABLET | ORAL | Status: DC | PRN

## 2014-08-23 MED ORDER — ACETAMINOPHEN 325 MG PO TABS
650.0000 mg | ORAL_TABLET | Freq: Once | ORAL | Status: AC
  Administered 2014-08-23: 650 mg via ORAL

## 2014-08-23 MED ORDER — PREDNISONE 20 MG PO TABS: 40.0000 mg | ORAL_TABLET | Freq: Every day | ORAL | Status: DC

## 2014-08-23 MED ORDER — DEXAMETHASONE SODIUM PHOSPHATE 4 MG/ML IJ SOLN
4.0000 mg | Freq: Once | INTRAMUSCULAR | Status: AC
  Administered 2014-08-23: 4 mg via INTRAVENOUS
  Filled 2014-08-23: qty 1

## 2014-08-23 NOTE — ED Notes (Signed)
Pt reports sore throat x 2 days with right sided throat swelling, right sided tonsil swelling; pt reports only PO water tolerated;

## 2014-08-23 NOTE — Discharge Instructions (Signed)
Please follow the directions provided.  Be sure to follow up with your primary care provider to ensure you are getting better.  Take the azithromycin daily until it is all gone.  Take the prednisone daily until it is all gone.  Take the zofran as needed for nausea.  Take the vicodin as needed for pain.  Be sure to drink plenty of water to stay well hydrated.  Don't hesitate to return for any new, worsening or concerning symptoms.     SEEK IMMEDIATE MEDICAL CARE IF:  You develop any new symptoms such as vomiting, severe headache, stiff or painful neck, chest pain, shortness of breath, or trouble swallowing.  You develop severe throat pain, drooling, or changes in your voice.  You develop swelling of the neck, or the skin on the neck becomes red and tender.  You develop signs of dehydration, such as fatigue, dry mouth, and decreased urination.  You become increasingly sleepy, or you cannot wake up completely.

## 2014-08-23 NOTE — ED Provider Notes (Signed)
CSN: [id number]     Arrival date & time 08/23/14  1914 History   First MD Initiated Contact with Patient 08/23/14 2028     Chief Complaint  Patient presents with  . Sore Throat  . Lymphadenopathy   (Consider location/radiation/quality/duration/timing/severity/associated sxs/prior Treatment) HPI  Shannon Cole is a 24 yo -year-old female presenting with sore throat and swollen glands. She states she first noticed tenderness and swelling to the left side of her neck and sore throat 2 days ago.  Later that night she developed a subjective fever and chills. The following day she noticed the right side of her neck was swollen and tender also. She states she has a history of strep throat but often test negative for strep. She reports her last mental period was 2 weeks ago and she takes a daily OCP. She reports she is able to eat and drink without difficulty but she reports a decreased appetite. She currently rates her pain as 6/10.  She denies any cough, nausea, vomiting or abd pain.    Past Medical History  Diagnosis Date  . Contraceptive management 02/12/2013   History reviewed. No pertinent past surgical history. Family History  Problem Relation Age of Onset  . Diabetes Mother   . Cancer Father     lung   History  Substance Use Topics  . Smoking status: Never Smoker   . Smokeless tobacco: Never Used  . Alcohol Use: Yes     Comment: socially   OB History    Gravida Para Term Preterm AB TAB SAB Ectopic Multiple Living   0 0 0 0 0 0 0 0 0 0      Review of Systems  Constitutional: Negative for fever and chills.  HENT: Positive for sore throat.   Eyes: Negative for visual disturbance.  Respiratory: Negative for cough and shortness of breath.   Cardiovascular: Negative for chest pain and leg swelling.  Gastrointestinal: Negative for nausea, vomiting and diarrhea.  Genitourinary: Negative for dysuria.  Musculoskeletal: Negative for myalgias.  Skin: Negative for rash.   Neurological: Negative for weakness, numbness and headaches.  Hematological: Positive for adenopathy.      Allergies  Augmentin  Home Medications   Prior to Admission medications   Medication Sig Start Date End Date Taking? Authorizing Provider  azithromycin (ZITHROMAX Z-PAK) 250 MG tablet Take 2 tablets (500 mg) on  Day 1,  followed by 1 tablet (250 mg) once daily on Days 2 through 5. 07/25/14   Babs SciaraScott A Luking, MD  Levonorgestrel-Ethinyl Estradiol (CAMRESE) 0.15-0.03 &0.01 MG tablet Take 1 tablet by mouth daily. 05/06/14   Adline PotterJennifer A Griffin, NP   BP 121/69 mmHg  Pulse 130  Temp(Src) 101.9 F (38.8 C)  Resp 16  Ht 5\' 4"  (1.626 m)  Wt 131 lb (59.421 kg)  BMI 22.47 kg/m2  SpO2 100%  LMP 08/09/2014 Physical Exam  Constitutional: She appears well-developed and well-nourished. No distress.  HENT:  Head: Normocephalic and atraumatic.  Mouth/Throat: No trismus in the jaw. No uvula swelling. Oropharyngeal exudate, posterior oropharyngeal edema and posterior oropharyngeal erythema present. No tonsillar abscesses.  Swollen, reddened tonsils with exudate bilaterally  Eyes: Conjunctivae are normal.  Neck: Neck supple. No thyromegaly present.  Cardiovascular: Normal rate, regular rhythm and intact distal pulses.   Pulmonary/Chest: Effort normal and breath sounds normal. No respiratory distress. She has no wheezes. She has no rales. She exhibits no tenderness.  Abdominal: Soft. There is no tenderness.  Musculoskeletal: She exhibits no tenderness.  Lymphadenopathy:  She has no cervical adenopathy.  Neurological: She is alert.  Skin: Skin is warm and dry. No rash noted. She is not diaphoretic.  Psychiatric: She has a normal mood and affect.  Nursing note and vitals reviewed.   ED Course  Procedures (including critical care time) Labs Review Labs Reviewed  RAPID STREP SCREEN  CULTURE, GROUP A STREP    Imaging Review No results found.   EKG Interpretation None      MDM    Final diagnoses:  Strep throat   24 yo with fever, sore throat, cervical and tonsillar lymphadenopathy, and tonsillar exudate. Despite a negative strep swab she has been clinically diagnosed with strep. Presentation non concerning for PTA or infxn spread to soft tissue. No trismus or uvula deviation.She appears mildly dehydrated, so she was treated in the ED NS bolus, steroids, and NSAIDs. She will be discharged with a prescription for azithromycin due to amoxicillin allergy. Discussed importance of water rehydration.  Specific return precautions discussed. Pt able to drink water in ED without difficulty with intact air way. Her tachycardia and discomfort improved after treatment. Pt is well-appearing, in no acute distress and vital signs reviewed and not-concerning.  They appear safe to be discharged.  Discharge include follow-up with their PCP.  Return precautions provided. Pt aware of plan and in agreement.    Filed Vitals:   08/23/14 2100 08/23/14 2200 08/23/14 2215 08/23/14 2300  BP: 110/53 108/56 114/65 108/73  Pulse: 108 111 111 92  Temp:      Resp:  16  16  Height:      Weight:      SpO2: 99% 99% 99% 100%   Meds given in ED:  Medications  acetaminophen (TYLENOL) tablet 650 mg (650 mg Oral Given 08/23/14 1945)  acetaminophen (TYLENOL) 325 MG tablet (  Duplicate 08/23/14 1945)  sodium chloride 0.9 % bolus 1,000 mL (0 mLs Intravenous Stopped 08/23/14 2215)  ketorolac (TORADOL) 30 MG/ML injection 30 mg (30 mg Intravenous Given 08/23/14 2107)  dexamethasone (DECADRON) injection 4 mg (4 mg Intravenous Given 08/23/14 2107)  azithromycin (ZITHROMAX) tablet 500 mg (500 mg Oral Given 08/23/14 2107)  sodium chloride 0.9 % bolus 1,000 mL (0 mLs Intravenous Stopped 08/23/14 2315)  ondansetron (ZOFRAN) injection 4 mg (4 mg Intravenous Given 08/23/14 2215)    Discharge Medication List as of 08/23/2014 11:22 PM    START taking these medications   Details  HYDROcodone-acetaminophen (NORCO/VICODIN)  5-325 MG per tablet Take 1 tablet by mouth every 4 (four) hours as needed., Starting 08/23/2014, Until Discontinued, Print    ondansetron (ZOFRAN) 4 MG tablet Take 1 tablet (4 mg total) by mouth every 6 (six) hours., Starting 08/23/2014, Until Discontinued, Print    predniSONE (DELTASONE) 20 MG tablet Take 2 tablets (40 mg total) by mouth daily., Starting 08/23/2014, Until Discontinued, Print       08/23/14 0000  azithromycin (ZITHROMAX) 500 MG tablet Daily Discontinue Reprint 08/23/14 2320   08/23/14 0000  predniSONE (DELTASONE) 20 MG tablet Daily Discontinue Reprint 08/23/14 2320   08/23/14 0000  HYDROcodone-acetaminophen (NORCO/VICODIN) 5-325 MG per tablet Every 4 hours PRN Discontinue Reprint 08/23/14 2320   08/23/14 0000  ondansetron (ZOFRAN) 4 MG tablet Every 6 hours Discontinue Reprint 08/23/14 2320           Harle Battiest, NP 08/24/14 0009  Bethann Berkshire, MD 08/24/14 2329

## 2014-08-26 LAB — CULTURE, GROUP A STREP: STREP A CULTURE: NEGATIVE

## 2014-10-16 ENCOUNTER — Ambulatory Visit: Payer: Federal, State, Local not specified - PPO | Admitting: Family Medicine

## 2014-11-10 ENCOUNTER — Ambulatory Visit: Payer: Federal, State, Local not specified - PPO | Admitting: Family Medicine

## 2014-11-19 ENCOUNTER — Ambulatory Visit: Payer: Federal, State, Local not specified - PPO | Admitting: Nurse Practitioner

## 2014-12-16 ENCOUNTER — Ambulatory Visit (INDEPENDENT_AMBULATORY_CARE_PROVIDER_SITE_OTHER): Payer: Federal, State, Local not specified - PPO | Admitting: Family Medicine

## 2014-12-16 ENCOUNTER — Encounter: Payer: Self-pay | Admitting: Family Medicine

## 2014-12-16 VITALS — BP 116/70 | Ht 64.0 in | Wt 140.5 lb

## 2014-12-16 DIAGNOSIS — J312 Chronic pharyngitis: Secondary | ICD-10-CM | POA: Diagnosis not present

## 2014-12-16 DIAGNOSIS — J301 Allergic rhinitis due to pollen: Secondary | ICD-10-CM | POA: Diagnosis not present

## 2014-12-16 NOTE — Progress Notes (Signed)
   Subjective:    Patient ID: Shannon Cole, female    DOB: [birth year], 24 y.o.   MRN: [id number]  HPI  Patient is in today to get a referral to and ENT and Allergist due to re occuring bouts with strep throat  Allergies bothers pt takes benadry, takes at night   Throat not sore for about a month  Recurrent challenges  No hx of ent in the past  Day # [id number]  t. Patient states no other concerns this visit.   Review of Systems No headache no chest pain no back pain no abdominal pain no change in bowel habits    Objective:   Physical Exam Alert vitals stable mild nasal congestion pharynx normal neck supple lungs clear heart regular in rhythm.       Assessment & Plan:  Impression 1 allergic rhinitis discussed not yet bad enough toward the allergy referral. Patient has only tried over-the-counter Benadryl No. 2 recurrent throat infections. Very frustrating for patient family. We'll do ENT referral as requested encouraged use Claritin daily WSL

## 2014-12-17 DIAGNOSIS — J312 Chronic pharyngitis: Secondary | ICD-10-CM | POA: Insufficient documentation

## 2014-12-17 DIAGNOSIS — J309 Allergic rhinitis, unspecified: Secondary | ICD-10-CM | POA: Insufficient documentation

## 2014-12-29 ENCOUNTER — Ambulatory Visit (INDEPENDENT_AMBULATORY_CARE_PROVIDER_SITE_OTHER): Payer: Federal, State, Local not specified - PPO

## 2015-01-29 ENCOUNTER — Other Ambulatory Visit: Payer: Self-pay | Admitting: Otolaryngology

## 2015-01-31 DIAGNOSIS — J353 Hypertrophy of tonsils with hypertrophy of adenoids: Secondary | ICD-10-CM

## 2015-01-31 HISTORY — DX: Hypertrophy of tonsils with hypertrophy of adenoids: J35.3

## 2015-02-24 ENCOUNTER — Encounter (HOSPITAL_BASED_OUTPATIENT_CLINIC_OR_DEPARTMENT_OTHER): Payer: Self-pay | Admitting: *Deleted

## 2015-02-27 ENCOUNTER — Telehealth: Payer: Self-pay | Admitting: Family Medicine

## 2015-02-27 ENCOUNTER — Telehealth: Payer: Self-pay | Admitting: *Deleted

## 2015-02-27 NOTE — Telephone Encounter (Signed)
Pt called stating that she has strep again and is needing a zpac called in.   [facility]

## 2015-02-27 NOTE — Telephone Encounter (Signed)
Having tonsils out, can crush OCs and put in pudding or ice cream

## 2015-02-27 NOTE — Telephone Encounter (Signed)
Called patient and informed her per Protocol that she must be seen for Strep test before she can be treated for strep. Encouraged patient to go to Urgent care today and to follow up with us if symptoms persist. Patient verbalized understanding.

## 2015-03-03 ENCOUNTER — Ambulatory Visit (HOSPITAL_BASED_OUTPATIENT_CLINIC_OR_DEPARTMENT_OTHER)
Admission: RE | Admit: 2015-03-03 | Discharge: 2015-03-03 | Disposition: A | Discharge status: Home / Self Care | Payer: Federal, State, Local not specified - PPO | Source: Ambulatory Visit | Attending: Otolaryngology | Admitting: Otolaryngology

## 2015-03-03 ENCOUNTER — Ambulatory Visit (HOSPITAL_BASED_OUTPATIENT_CLINIC_OR_DEPARTMENT_OTHER): Payer: Federal, State, Local not specified - PPO | Admitting: Anesthesiology

## 2015-03-03 ENCOUNTER — Encounter (HOSPITAL_BASED_OUTPATIENT_CLINIC_OR_DEPARTMENT_OTHER): Payer: Self-pay | Admitting: *Deleted

## 2015-03-03 ENCOUNTER — Encounter (HOSPITAL_BASED_OUTPATIENT_CLINIC_OR_DEPARTMENT_OTHER)
Admission: RE | Disposition: A | Discharge status: Home / Self Care | Payer: Self-pay | Source: Ambulatory Visit | Attending: Otolaryngology

## 2015-03-03 DIAGNOSIS — J312 Chronic pharyngitis: Secondary | ICD-10-CM | POA: Insufficient documentation

## 2015-03-03 DIAGNOSIS — J353 Hypertrophy of tonsils with hypertrophy of adenoids: Secondary | ICD-10-CM | POA: Diagnosis not present

## 2015-03-03 DIAGNOSIS — J3501 Chronic tonsillitis: Secondary | ICD-10-CM | POA: Insufficient documentation

## 2015-03-03 HISTORY — DX: Other symptoms and signs involving the musculoskeletal system: R29.898

## 2015-03-03 HISTORY — DX: Hypertrophy of tonsils with hypertrophy of adenoids: J35.3

## 2015-03-03 HISTORY — PX: TONSILLECTOMY AND ADENOIDECTOMY: SHX28

## 2015-03-03 SURGERY — TONSILLECTOMY AND ADENOIDECTOMY
Anesthesia: General | Laterality: Bilateral

## 2015-03-03 MED ORDER — OXYCODONE HCL 5 MG/5ML PO SOLN: 5.0000 mg | Freq: Once | ORAL | Status: DC | PRN

## 2015-03-03 MED ORDER — ATROPINE SULFATE 0.4 MG/ML IJ SOLN
INTRAMUSCULAR | Status: AC
  Filled 2015-03-03: qty 1

## 2015-03-03 MED ORDER — BACITRACIN 500 UNIT/GM EX OINT
TOPICAL_OINTMENT | CUTANEOUS | Status: DC | PRN
Start: 2015-03-03 — End: 2015-03-03
  Administered 2015-03-03: 1 via TOPICAL

## 2015-03-03 MED ORDER — FENTANYL CITRATE (PF) 100 MCG/2ML IJ SOLN
INTRAMUSCULAR | Status: AC
  Filled 2015-03-03: qty 4

## 2015-03-03 MED ORDER — SCOPOLAMINE 1 MG/3DAYS TD PT72: 1.0000 | MEDICATED_PATCH | Freq: Once | TRANSDERMAL | Status: DC | PRN

## 2015-03-03 MED ORDER — SODIUM CHLORIDE 0.9 % IR SOLN
Status: DC | PRN
  Administered 2015-03-03: 1

## 2015-03-03 MED ORDER — GLYCOPYRROLATE 0.2 MG/ML IJ SOLN: 0.2000 mg | Freq: Once | INTRAMUSCULAR | Status: DC | PRN

## 2015-03-03 MED ORDER — PROMETHAZINE HCL 25 MG/ML IJ SOLN
INTRAMUSCULAR | Status: AC
  Filled 2015-03-03: qty 1

## 2015-03-03 MED ORDER — LIDOCAINE HCL (CARDIAC) 20 MG/ML IV SOLN
INTRAVENOUS | Status: AC
  Filled 2015-03-03: qty 5

## 2015-03-03 MED ORDER — DEXAMETHASONE SODIUM PHOSPHATE 10 MG/ML IJ SOLN
INTRAMUSCULAR | Status: AC
  Filled 2015-03-03: qty 1

## 2015-03-03 MED ORDER — MEPERIDINE HCL 25 MG/ML IJ SOLN: 6.2500 mg | INTRAMUSCULAR | Status: DC | PRN

## 2015-03-03 MED ORDER — OXYCODONE HCL 5 MG PO TABS: 5.0000 mg | ORAL_TABLET | Freq: Once | ORAL | Status: DC | PRN

## 2015-03-03 MED ORDER — PROMETHAZINE HCL 25 MG/ML IJ SOLN
6.2500 mg | Freq: Once | INTRAMUSCULAR | Status: AC | PRN
  Administered 2015-03-03: 6.25 mg via INTRAVENOUS

## 2015-03-03 MED ORDER — DEXAMETHASONE SODIUM PHOSPHATE 4 MG/ML IJ SOLN
INTRAMUSCULAR | Status: DC | PRN
  Administered 2015-03-03: 10 mg via INTRAVENOUS

## 2015-03-03 MED ORDER — ONDANSETRON HCL 4 MG/2ML IJ SOLN
INTRAMUSCULAR | Status: DC | PRN
  Administered 2015-03-03: 4 mg via INTRAVENOUS

## 2015-03-03 MED ORDER — ONDANSETRON HCL 4 MG/2ML IJ SOLN
INTRAMUSCULAR | Status: AC
  Filled 2015-03-03: qty 2

## 2015-03-03 MED ORDER — MIDAZOLAM HCL 2 MG/2ML IJ SOLN
1.0000 mg | INTRAMUSCULAR | Status: DC | PRN
  Administered 2015-03-03: 2 mg via INTRAVENOUS

## 2015-03-03 MED ORDER — HYDROMORPHONE HCL 1 MG/ML IJ SOLN
0.2500 mg | INTRAMUSCULAR | Status: DC | PRN
  Administered 2015-03-03: 0.25 mg via INTRAVENOUS
  Administered 2015-03-03: 0.5 mg via INTRAVENOUS
  Administered 2015-03-03: 0.25 mg via INTRAVENOUS

## 2015-03-03 MED ORDER — OXYMETAZOLINE HCL 0.05 % NA SOLN
NASAL | Status: DC | PRN
  Administered 2015-03-03: 1 via TOPICAL

## 2015-03-03 MED ORDER — MIDAZOLAM HCL 2 MG/2ML IJ SOLN
INTRAMUSCULAR | Status: AC
  Filled 2015-03-03: qty 4

## 2015-03-03 MED ORDER — HYDROMORPHONE HCL 1 MG/ML IJ SOLN
INTRAMUSCULAR | Status: AC
  Filled 2015-03-03: qty 1

## 2015-03-03 MED ORDER — PROPOFOL 10 MG/ML IV BOLUS
INTRAVENOUS | Status: DC | PRN
  Administered 2015-03-03: 200 mg via INTRAVENOUS

## 2015-03-03 MED ORDER — OXYCODONE HCL 5 MG/5ML PO SOLN: 5.0000 mg | ORAL | Status: DC | PRN

## 2015-03-03 MED ORDER — LACTATED RINGERS IV SOLN
INTRAVENOUS | Status: DC
  Administered 2015-03-03 (×2): via INTRAVENOUS

## 2015-03-03 MED ORDER — LIDOCAINE HCL (CARDIAC) 20 MG/ML IV SOLN
INTRAVENOUS | Status: DC | PRN
  Administered 2015-03-03: 50 mg via INTRAVENOUS

## 2015-03-03 MED ORDER — FENTANYL CITRATE (PF) 100 MCG/2ML IJ SOLN
50.0000 ug | INTRAMUSCULAR | Status: AC | PRN
  Administered 2015-03-03: 100 ug via INTRAVENOUS
  Administered 2015-03-03 (×2): 50 ug via INTRAVENOUS

## 2015-03-03 MED ORDER — SUCCINYLCHOLINE CHLORIDE 20 MG/ML IJ SOLN
INTRAMUSCULAR | Status: AC
  Filled 2015-03-03: qty 1

## 2015-03-03 MED ORDER — SUCCINYLCHOLINE CHLORIDE 20 MG/ML IJ SOLN
INTRAMUSCULAR | Status: DC | PRN
  Administered 2015-03-03: 100 mg via INTRAVENOUS

## 2015-03-03 SURGICAL SUPPLY — 31 items
BANDAGE COBAN STERILE 2 (GAUZE/BANDAGES/DRESSINGS) IMPLANT
CANISTER SUCT 1200ML W/VALVE (MISCELLANEOUS) ×2 IMPLANT
CATH ROBINSON RED A/P 10FR (CATHETERS) IMPLANT
CATH ROBINSON RED A/P 14FR (CATHETERS) ×2 IMPLANT
COAGULATOR SUCT SWTCH 10FR 6 (ELECTROSURGICAL) IMPLANT
COVER MAYO STAND STRL (DRAPES) ×2 IMPLANT
ELECT REM PT RETURN 9FT ADLT (ELECTROSURGICAL) ×2
ELECT REM PT RETURN 9FT PED (ELECTROSURGICAL)
ELECTRODE REM PT RETRN 9FT PED (ELECTROSURGICAL) IMPLANT
ELECTRODE REM PT RTRN 9FT ADLT (ELECTROSURGICAL) ×1 IMPLANT
GLOVE BIO SURGEON STRL SZ 6.5 (GLOVE) ×2 IMPLANT
GLOVE BIO SURGEON STRL SZ7.5 (GLOVE) ×2 IMPLANT
GLOVE BIOGEL PI IND STRL 7.0 (GLOVE) ×1 IMPLANT
GLOVE BIOGEL PI INDICATOR 7.0 (GLOVE) ×1
GOWN STRL REUS W/ TWL LRG LVL3 (GOWN DISPOSABLE) ×2 IMPLANT
GOWN STRL REUS W/TWL LRG LVL3 (GOWN DISPOSABLE) ×2
IV NS 500ML (IV SOLUTION) ×1
IV NS 500ML BAXH (IV SOLUTION) ×1 IMPLANT
MARKER SKIN DUAL TIP RULER LAB (MISCELLANEOUS) IMPLANT
NS IRRIG 1000ML POUR BTL (IV SOLUTION) ×2 IMPLANT
SHEET MEDIUM DRAPE 40X70 STRL (DRAPES) ×2 IMPLANT
SOLUTION BUTLER CLEAR DIP (MISCELLANEOUS) ×2 IMPLANT
SPONGE GAUZE 4X4 12PLY STER LF (GAUZE/BANDAGES/DRESSINGS) ×2 IMPLANT
SPONGE TONSIL 1 RF SGL (DISPOSABLE) IMPLANT
SPONGE TONSIL 1.25 RF SGL STRG (GAUZE/BANDAGES/DRESSINGS) ×2 IMPLANT
SYR BULB 3OZ (MISCELLANEOUS) IMPLANT
TOWEL OR 17X24 6PK STRL BLUE (TOWEL DISPOSABLE) ×2 IMPLANT
TUBE CONNECTING 20X1/4 (TUBING) ×2 IMPLANT
TUBE SALEM SUMP 12R W/ARV (TUBING) IMPLANT
TUBE SALEM SUMP 16 FR W/ARV (TUBING) ×2 IMPLANT
WAND COBLATOR 70 EVAC XTRA (SURGICAL WAND) ×2 IMPLANT

## 2015-03-03 NOTE — Discharge Instructions (Addendum)
Shannon Cole M.D., P.A. °Postoperative Instructions for Tonsillectomy & Adenoidectomy (T&A) °Activity °Restrict activity at home for the first two days, resting as much as possible. Light indoor activity is best. You may usually return to school or work within a week but void strenuous activity and sports for two weeks. Sleep with your head elevated on 2-3 pillows for 3-4 days to help decrease swelling. °Diet °Due to tissue swelling and throat discomfort, you may have little desire to drink for several days. However fluids are very important to prevent dehydration. You will find that non-acidic juices, soups, popsicles, Jell-O, custard, puddings, and any soft or mashed foods taken in small quantities can be swallowed fairly easily. Try to increase your fluid and food intake as the discomfort subsides. It is recommended that a child receive 1-1/2 quarts of fluid in a 24-hour period. Adult require twice this amount.  °Discomfort °Your sore throat may be relieved by applying an ice collar to your neck and/or by taking Tylenol®. You may experience an earache, which is due to referred pain from the throat. Referred ear pain is commonly felt at night when trying to rest. ° °Bleeding                        Although rare, there is risk of having some bleeding during the first 2 weeks after having a T&A. This usually happens between days 7-10 postoperatively. If you or your child should have any bleeding, try to remain calm. We recommend sitting up quietly in a chair and gently spitting out the blood into a bowl. For adults, gargling gently with ice water may help. If the bleeding does not stop after a short time (5 minutes), is more than 1 teaspoonful, or if you become worried, please call our office at [phone] or go directly to the nearest hospital emergency room. Do not eat or drink anything prior to going to the hospital as you may need to be taken to the operating room in order to control the bleeding. °GENERAL  CONSIDERATIONS °1. Brush your teeth regularly. Avoid mouthwashes and gargles for three weeks. You may gargle gently with warm salt-water as necessary or spray with Chloraseptic®. You may make salt-water by placing 2 teaspoons of table salt into a quart of fresh water. Warm the salt-water in a microwave to a luke warm temperature.  °2. Avoid exposure to colds and upper respiratory infections if possible.  °3. If you look into a mirror or into your child's mouth, you will see white-gray patches in the back of the throat. This is normal after having a T&A and is like a scab that forms on the skin after an abrasion. It will disappear once the back of the throat heals completely. However, it may cause a noticeable odor; this too will disappear with time. Again, warm salt-water gargles may be used to help keep the throat clean and promote healing.  °4. You may notice a temporary change in voice quality, such as a higher pitched voice or a nasal sound, until healing is complete. This may last for 1-2 weeks and should resolve.  °5. Do not take or give you child any medications that we have not prescribed or recommended.  °6. Snoring may occur, especially at night, for the first week after a T&A. It is due to swelling of the soft palate and will usually resolve.  °Please call our office at [phone] if you have any questions.   ° ° ° °  Post Anesthesia Home Care Instructions ° °Activity: °Get plenty of rest for the remainder of the day. A responsible adult should stay with you for 24 hours following the procedure.  °For the next 24 hours, DO NOT: °-Drive a car °-Operate machinery °-Drink alcoholic beverages °-Take any medication unless instructed by your physician °-Make any legal decisions or sign important papers. ° °Meals: °Start with liquid foods such as gelatin or soup. Progress to regular foods as tolerated. Avoid greasy, spicy, heavy foods. If nausea and/or vomiting occur, drink only clear liquids until the nausea  and/or vomiting subsides. Call your physician if vomiting continues. ° °Special Instructions/Symptoms: °Your throat may feel dry or sore from the anesthesia or the breathing tube placed in your throat during surgery. If this causes discomfort, gargle with warm salt water. The discomfort should disappear within 24 hours. ° °If you had a scopolamine patch placed behind your ear for the management of post- operative nausea and/or vomiting: ° °1. The medication in the patch is effective for 72 hours, after which it should be removed.  Wrap patch in a tissue and discard in the trash. Wash hands thoroughly with soap and water. °2. You may remove the patch earlier than 72 hours if you experience unpleasant side effects which may include dry mouth, dizziness or visual disturbances. °3. Avoid touching the patch. Wash your hands with soap and water after contact with the patch. °  ° °

## 2015-03-03 NOTE — Anesthesia Postprocedure Evaluation (Signed)
  Anesthesia Post-op Note  Patient: Shannon Cole  Procedure(s) Performed: Procedure(s): TONSILLECTOMY AND ADENOIDECTOMY (Bilateral)  Patient Location: PACU  Anesthesia Type: General   Level of Consciousness: awake, alert  and oriented  Airway and Oxygen Therapy: Patient Spontanous Breathing  Post-op Pain: mild  Post-op Assessment: Post-op Vital signs reviewed  Post-op Vital Signs: Reviewed  Last Vitals:  Filed Vitals:   03/03/15 0945  BP: 122/70  Pulse: 103  Temp:   Resp: 14    Complications: No apparent anesthesia complications

## 2015-03-03 NOTE — H&P (Signed)
Cc: Chronic tonsillitis/pharyngitis  HPI: The patient is a 24 y/o female who presents today for evaluation of recurrent tonsillitis. The patient is seen in consultation requested by Dr. Lubertha SouthSteve Luking. The patient has noted recurrent throat infections for the past several years. She has been treated for tonsillitis 5 times in the last 4 months. The patient last took an antibiotic 4 weeks ago. She currently denies any significant throat discomfort. No dysphagia or odynophagia is noted. The patient is otherwise healthy. Tobacco use is denied. No previous ENT surgery is noted.  The patient's review of systems (constitutional, eyes, ENT, cardiovascular, respiratory, GI, musculoskeletal, skin, neurologic, psychiatric, endocrine, hematologic, allergic) is noted in the ROS questionnaire.  It is reviewed with the patient.   Family health history: None.   Major events: None.   Ongoing medical problems: None.   Social history: The patient is single. She denies the use of tobacco or illegal drugs. She drinks alcohol on a social basis.  Exam General: Communicates without difficulty, well nourished, no acute distress. Head:  Normocephalic, no lesions or asymmetry. Eyes: PERRL, EOMI. No scleral icterus, conjunctivae clear.  Neuro: CN II exam reveals vision grossly intact.  No nystagmus at any point of gaze. Ears:  EAC normal without erythema AU.  TM intact without fluid and mobile AU. Nose: Moist, pink mucosa without lesions or mass. Mouth: Oral cavity clear and moist, no lesions, tonsils symmetric. Tonsils are 2+ with mild erythema. Neck: Full range of motion, no lymphadenopathy or masses.   Assessment 1.  The patient's history and physical exam findings are consistent with chronic tonsillitis/pharyngitis secondary to adenotonsillar hypertrophy.  Plan 1. The treatment options include continuing conservative observation versus adenotonsillectomy.  Based on the patient's history and physical exam findings, the  patient will likely benefit from having the tonsils and adenoid removed.  The risks, benefits, alternatives, and details of the procedure are reviewed with the patient.  Questions are invited and answered.  2. The patient is interested in proceeding with the procedure.  We will schedule the procedure in accordance with the family schedule.

## 2015-03-03 NOTE — Op Note (Signed)
DATE OF PROCEDURE:  03/03/2015                              OPERATIVE REPORT  SURGEON:  Newman PiesSu Spurgeon Gancarz, MD  PREOPERATIVE DIAGNOSES: 1. Adenotonsillar hypertrophy. 2. Chronic tonsillitis and pharyngitis  POSTOPERATIVE DIAGNOSES: 1. Adenotonsillar hypertrophy. 2. Chronic tonsillitis and pharyngitis  PROCEDURE PERFORMED:  Adenotonsillectomy.  ANESTHESIA:  General endotracheal tube anesthesia.  COMPLICATIONS:  None.  ESTIMATED BLOOD LOSS:  Minimal.  INDICATION FOR PROCEDURE:  Lawernce PittsKatherine Dettore is a 24 y.o. female with a history of chronic tonsillitis/pharyngitis and halitosis.  According to the patient, she has been experiencing chronic throat discomfort with recurrent infections for several years. The patient continues to be symptomatic despite medical treatments. On examination, the patient was noted to have bilateral cryptic tonsils, with numerous tonsilloliths. Based on the above findings, the decision was made for the patient to undergo the adenotonsillectomy procedure. Likelihood of success in reducing symptoms was also discussed.  The risks, benefits, alternatives, and details of the procedure were discussed with the mother.  Questions were invited and answered.  Informed consent was obtained.  DESCRIPTION:  The patient was taken to the operating room and placed supine on the operating table.  General endotracheal tube anesthesia was administered by the anesthesiologist.  The patient was positioned and prepped and draped in a standard fashion for adenotonsillectomy.  A Crowe-Stumpp mouth gag was inserted into the oral cavity for exposure. 3+ cryptic tonsils were noted bilaterally.  No bifidity was noted.  Indirect mirror examination of the nasopharynx revealed mild adenoid hypertrophy. The adenoid was ablated with the Coblator device. Hemostasis was achieved with the Coblator device.  The right tonsil was then grasped with a straight Allis clamp and retracted medially.  It was resected free from the  underlying pharyngeal constrictor muscles with the Coblator device.  The same procedure was repeated on the left side without exception.  The surgical sites were copiously irrigated.  The mouth gag was removed.  The care of the patient was turned over to the anesthesiologist.  The patient was awakened from anesthesia without difficulty.  The patient was extubated and transferred to the recovery room in good condition.  OPERATIVE FINDINGS:  Adenotonsillar hypertrophy.  SPECIMEN:  Bilateral tonsils  FOLLOWUP CARE:  The patient will be discharged home once awake and alert.  She will be placed on oxycodone 5-910ml po q 4 hours for postop pain control.   The patient will follow up in my office in approximately 2 weeks.  Flora Ratz,SUI W 03/03/2015 8:41 AM

## 2015-03-03 NOTE — Anesthesia Preprocedure Evaluation (Signed)

## 2015-03-03 NOTE — Anesthesia Procedure Notes (Signed)
Procedure Name: Intubation Date/Time: 03/03/2015 8:02 AM Performed by: Caren MacadamARTER, Martise Waddell W Pre-anesthesia Checklist: Patient identified, Emergency Drugs available, Suction available and Patient being monitored Patient Re-evaluated:Patient Re-evaluated prior to inductionOxygen Delivery Method: Circle System Utilized Preoxygenation: Pre-oxygenation with 100% oxygen Intubation Type: IV induction Ventilation: Mask ventilation without difficulty Laryngoscope Size: Miller and 2 Grade View: Grade I Tube type: Oral Tube size: 7.0 mm Number of attempts: 1 Airway Equipment and Method: Stylet and Oral airway Placement Confirmation: ETT inserted through vocal cords under direct vision,  positive ETCO2 and breath sounds checked- equal and bilateral Secured at: 22 cm Tube secured with: Tape Dental Injury: Teeth and Oropharynx as per pre-operative assessment

## 2015-03-03 NOTE — Transfer of Care (Signed)
Immediate Anesthesia Transfer of Care Note  Patient: Shannon Cole  Procedure(s) Performed: Procedure(s): TONSILLECTOMY AND ADENOIDECTOMY (Bilateral)  Patient Location: PACU  Anesthesia Type:General  Level of Consciousness: awake  Airway & Oxygen Therapy: Patient Spontanous Breathing and Patient connected to face mask oxygen  Post-op Assessment: Report given to RN and Post -op Vital signs reviewed and stable  Post vital signs: Reviewed and stable  Last Vitals:  Filed Vitals:   03/03/15 0648  BP: 124/68  Pulse: 91  Temp: 36.9 C  Resp: 16    Complications: No apparent anesthesia complications

## 2015-03-04 ENCOUNTER — Encounter (HOSPITAL_BASED_OUTPATIENT_CLINIC_OR_DEPARTMENT_OTHER): Payer: Self-pay | Admitting: Otolaryngology

## 2015-05-12 ENCOUNTER — Encounter: Payer: Self-pay | Admitting: Adult Health

## 2015-05-12 ENCOUNTER — Ambulatory Visit (INDEPENDENT_AMBULATORY_CARE_PROVIDER_SITE_OTHER): Payer: Federal, State, Local not specified - PPO | Admitting: Adult Health

## 2015-05-12 VITALS — BP 118/70 | HR 76 | Ht 65.0 in | Wt 140.0 lb

## 2015-05-12 DIAGNOSIS — Z01419 Encounter for gynecological examination (general) (routine) without abnormal findings: Secondary | ICD-10-CM | POA: Diagnosis not present

## 2015-05-12 DIAGNOSIS — Z3041 Encounter for surveillance of contraceptive pills: Secondary | ICD-10-CM

## 2015-05-12 MED ORDER — NORETHIN-ETH ESTRAD TRIPHASIC 0.5/0.75/1-35 MG-MCG PO TABS: 1.0000 | ORAL_TABLET | Freq: Every day | ORAL | Status: DC

## 2015-05-12 NOTE — Patient Instructions (Signed)
Physical in 1 year Continue OCs  

## 2015-05-12 NOTE — Progress Notes (Addendum)
Patient ID: Shannon PittsKatherine Cole, female   DOB: 11/10/1990, 25 y.o.   MRN: [id number] History of Present Illness:  Shannon LeatherwoodKatherine is a 25 year old white female in for well woman gyn exam and refill OCs.No complaints. She had a normal pap 05/06/14.  Current Medications, Allergies, Past Medical History, Past Surgical History, Family History and Social History were reviewed in [facility] electronic medical record.     Review of Systems: Patient denies any headaches, hearing loss, fatigue, blurred vision, shortness of breath, chest pain, abdominal pain, problems with bowel movements, urination, or intercourse. No joint pain or mood swings.   Physical Exam:BP 118/70 mmHg  Pulse 76  Ht 5\' 5"  (1.651 m)  Wt 140 lb (63.504 kg)  BMI 23.30 kg/m2  LMP 05/03/2015 General:  Well developed, well nourished, no acute distress Skin:  Warm and dry Neck:  Midline trachea, normal thyroid, good ROM, no lymphadenopathy Lungs; Clear to auscultation bilaterally Breast:  No dominant palpable mass, retraction, or nipple discharge Cardiovascular: Regular rate and rhythm Abdomen:  Soft, non tender, no hepatosplenomegaly Pelvic:  External genitalia is normal in appearance, no lesions.  The vagina is normal in appearance. Urethra has no lesions or masses. The cervix is smooth.  Uterus is felt to be normal size, shape, and contour.  No adnexal masses or tenderness noted.Bladder is non tender, no masses felt. Extremities/musculoskeletal:  No swelling or varicosities noted, no clubbing or cyanosis Psych:  No mood changes, alert and cooperative,seems happy Pt declines STD testing or labs.  Impression: Well woman gyn exam no pap Contraceptive management    Plan: Refilled cyclafem x 1 year Physical in 1 year

## 2015-05-25 ENCOUNTER — Telehealth: Payer: Self-pay | Admitting: *Deleted

## 2015-05-25 ENCOUNTER — Telehealth: Payer: Self-pay | Admitting: Adult Health

## 2015-05-25 MED ORDER — NORETHIN-ETH ESTRAD TRIPHASIC 0.5/0.75/1-35 MG-MCG PO TABS: 1.0000 | ORAL_TABLET | Freq: Every day | ORAL | Status: DC

## 2015-05-25 MED ORDER — LEVONORGEST-ETH ESTRAD 91-DAY 0.15-0.03 &0.01 MG PO TABS: 1.0000 | ORAL_TABLET | Freq: Every day | ORAL | Status: DC

## 2015-05-25 NOTE — Telephone Encounter (Signed)
Pharmacy [facility] called pt wants camrese, will rx that

## 2015-05-25 NOTE — Telephone Encounter (Signed)
Will refill pills with 3 month supply

## 2015-07-31 ENCOUNTER — Other Ambulatory Visit: Payer: Self-pay | Admitting: Adult Health

## 2015-08-18 ENCOUNTER — Ambulatory Visit (INDEPENDENT_AMBULATORY_CARE_PROVIDER_SITE_OTHER): Payer: Federal, State, Local not specified - PPO | Admitting: Family Medicine

## 2015-08-18 ENCOUNTER — Encounter: Payer: Self-pay | Admitting: Family Medicine

## 2015-08-18 VITALS — BP 110/70 | Ht 64.0 in | Wt 142.4 lb

## 2015-08-18 DIAGNOSIS — F4310 Post-traumatic stress disorder, unspecified: Secondary | ICD-10-CM

## 2015-08-18 DIAGNOSIS — R0682 Tachypnea, not elsewhere classified: Secondary | ICD-10-CM

## 2015-08-18 DIAGNOSIS — F411 Generalized anxiety disorder: Secondary | ICD-10-CM | POA: Diagnosis not present

## 2015-08-18 MED ORDER — ESCITALOPRAM OXALATE 10 MG PO TABS: 10.0000 mg | ORAL_TABLET | Freq: Every day | ORAL | Status: DC

## 2015-08-18 MED ORDER — ALPRAZOLAM 0.5 MG PO TABS: ORAL_TABLET | ORAL | Status: DC
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
## 2015-08-18 NOTE — Patient Instructions (Signed)
Generalized Anxiety Disorder Generalized anxiety disorder (GAD) is a mental disorder. It interferes with life functions, including relationships, work, and school. GAD is different from normal anxiety, which everyone experiences at some point in their lives in response to specific life events and activities. Normal anxiety actually helps us prepare for and get through these life events and activities. Normal anxiety goes away after the event or activity is over.  GAD causes anxiety that is not necessarily related to specific events or activities. It also causes excess anxiety in proportion to specific events or activities. The anxiety associated with GAD is also difficult to control. GAD can vary from mild to severe. People with severe GAD can have intense waves of anxiety with physical symptoms (panic attacks).  SYMPTOMS The anxiety and worry associated with GAD are difficult to control. This anxiety and worry are related to many life events and activities and also occur more days than not for 6 months or longer. People with GAD also have three or more of the following symptoms (one or more in children):  Restlessness.   Fatigue.  Difficulty concentrating.   Irritability.  Muscle tension.  Difficulty sleeping or unsatisfying sleep. DIAGNOSIS GAD is diagnosed through an assessment by your health care provider. Your health care provider will ask you questions aboutyour mood,physical symptoms, and events in your life. Your health care provider may ask you about your medical history and use of alcohol or drugs, including prescription medicines. Your health care provider may also do a physical exam and blood tests. Certain medical conditions and the use of certain substances can cause symptoms similar to those associated with GAD. Your health care provider may refer you to a mental health specialist for further evaluation. TREATMENT The following therapies are usually used to treat GAD:    Medication. Antidepressant medication usually is prescribed for long-term daily control. Antianxiety medicines may be added in severe cases, especially when panic attacks occur.   Talk therapy (psychotherapy). Certain types of talk therapy can be helpful in treating GAD by providing support, education, and guidance. A form of talk therapy called cognitive behavioral therapy can teach you healthy ways to think about and react to daily life events and activities.  Stress managementtechniques. These include yoga, meditation, and exercise and can be very helpful when they are practiced regularly. A mental health specialist can help determine which treatment is best for you. Some people see improvement with one therapy. However, other people require a combination of therapies.   This information is not intended to replace advice given to you by your health care provider. Make sure you discuss any questions you have with your health care provider.   Document Released: 08/13/2012 Document Revised: 05/09/2014 Document Reviewed: 08/13/2012 Elsevier Interactive Patient Education 2016 [employer]  

## 2015-08-26 ENCOUNTER — Telehealth: Payer: Self-pay | Admitting: Family Medicine

## 2015-08-26 NOTE — Telephone Encounter (Signed)
May be overly sens , take none three day, then resume at just one half tab qam

## 2015-08-26 NOTE — Telephone Encounter (Signed)
Notified patient may be overly sensitive , take none three day, then resume at just one half tab qam. Patient verbalized understanding.

## 2015-08-26 NOTE — Telephone Encounter (Signed)
escitalopram (LEXAPRO) 10 MG tablet  Pt states she has taken this med now for about 6 days and  She has been having headaches and trouble sleeping since. Wants to know if she needs to give it more time or to stop and  Try something else?   Please advise   [facility]

## 2015-10-06 ENCOUNTER — Ambulatory Visit: Payer: Federal, State, Local not specified - PPO | Admitting: Family Medicine

## 2016-01-29 ENCOUNTER — Other Ambulatory Visit: Payer: Self-pay | Admitting: Adult Health

## 2016-04-15 DIAGNOSIS — M546 Pain in thoracic spine: Secondary | ICD-10-CM | POA: Diagnosis not present

## 2016-04-15 DIAGNOSIS — M9902 Segmental and somatic dysfunction of thoracic region: Secondary | ICD-10-CM | POA: Diagnosis not present

## 2016-04-15 DIAGNOSIS — M9903 Segmental and somatic dysfunction of lumbar region: Secondary | ICD-10-CM | POA: Diagnosis not present

## 2016-04-15 DIAGNOSIS — M545 Low back pain: Secondary | ICD-10-CM | POA: Diagnosis not present

## 2016-05-17 ENCOUNTER — Other Ambulatory Visit: Payer: Federal, State, Local not specified - PPO | Admitting: Adult Health

## 2016-05-17 DIAGNOSIS — K080 Exfoliation of teeth due to systemic causes: Secondary | ICD-10-CM | POA: Diagnosis not present

## 2016-05-18 ENCOUNTER — Other Ambulatory Visit: Payer: Federal, State, Local not specified - PPO | Admitting: Adult Health

## 2016-05-31 ENCOUNTER — Ambulatory Visit (INDEPENDENT_AMBULATORY_CARE_PROVIDER_SITE_OTHER): Payer: Federal, State, Local not specified - PPO | Admitting: Adult Health

## 2016-05-31 ENCOUNTER — Encounter: Payer: Self-pay | Admitting: Adult Health

## 2016-05-31 ENCOUNTER — Other Ambulatory Visit (HOSPITAL_COMMUNITY)
Admission: RE | Admit: 2016-05-31 | Discharge: 2016-05-31 | Disposition: A | Discharge status: Home / Self Care | Payer: Federal, State, Local not specified - PPO | Source: Ambulatory Visit | Attending: Obstetrics & Gynecology | Admitting: Obstetrics & Gynecology

## 2016-05-31 VITALS — BP 110/60 | HR 79 | Ht 64.25 in | Wt 142.5 lb

## 2016-05-31 DIAGNOSIS — Z01419 Encounter for gynecological examination (general) (routine) without abnormal findings: Secondary | ICD-10-CM | POA: Insufficient documentation

## 2016-05-31 DIAGNOSIS — Z3041 Encounter for surveillance of contraceptive pills: Secondary | ICD-10-CM

## 2016-05-31 MED ORDER — LEVONORGEST-ETH ESTRAD 91-DAY 0.15-0.03 &0.01 MG PO TABS: 1.0000 | ORAL_TABLET | Freq: Every day | ORAL | 4 refills | Status: DC

## 2016-05-31 NOTE — Patient Instructions (Signed)
Physical in 1 year 

## 2016-05-31 NOTE — Progress Notes (Signed)
Patient ID: Shannon Cole, female   DOB: 09/29/1990, 26 y.o.   MRN: [id number] History of Present Illness:  Shannon Cole is a  26 year old white female in for well woman gyn exam and pap. PCP is Lubertha SouthSteve Luking.  Current Medications, Allergies, Past Medical History, Past Surgical History, Family History and Social History were reviewed in [facility] electronic medical record.     Review of Systems: Patient denies any headaches, hearing loss, fatigue, blurred vision, shortness of breath, chest pain, abdominal pain, problems with bowel movements, urination, or intercourse. No joint pain or mood swings.Has decreased sex drive she says but may have sex twice a day at times, ot once a week, no problem with function.    Physical Exam:BP 110/60 (BP Location: Left Arm, Patient Position: Sitting, Cuff Size: Normal)   Pulse 79   Ht 5' 4.25" (1.632 m)   Wt 142 lb 8 oz (64.6 kg)   BMI 24.27 kg/m  General:  Well developed, well nourished, no acute distress Skin:  Warm and dry Neck:  Midline trachea, normal thyroid, good ROM, no lymphadenopathy Lungs; Clear to auscultation bilaterally Breast:  No dominant palpable mass, retraction, or nipple discharge Cardiovascular: Regular rate and rhythm Abdomen:  Soft, non tender, no hepatosplenomegaly Pelvic:  External genitalia is normal in appearance, no lesions.  The vagina is normal in appearance. Urethra has no lesions or masses. The cervix is smooth,pap with reflex HPV perfroremd.  Uterus is felt to be normal size, shape, and contour.  No adnexal masses or tenderness noted.Bladder is non tender, no masses felt. Extremities/musculoskeletal:  No swelling or varicosities noted, no clubbing or cyanosis Psych:  No mood changes, alert and cooperative,seems happy PHQ 2 score 0.Explained that with OCs, sex drive can fluctuate.   Impression:  1. Encounter for gynecological examination with Papanicolaou smear of cervix   2. Encounter for surveillance of contraceptive  pills      Plan: Meds ordered this encounter  Medications  . Levonorgestrel-Ethinyl Estradiol (AMETHIA,CAMRESE) 0.15-0.03 &0.01 MG tablet    Sig: Take 1 tablet by mouth daily.    Dispense:  91 tablet    Refill:  4    Order Specific Question:   Supervising Provider    Answer:   Lazaro ArmsEURE, LUTHER H [[id number]]  Physical in 1 year Pap in 3 if normal

## 2016-06-01 LAB — CYTOLOGY - PAP
ADEQUACY: ABSENT
Diagnosis: NEGATIVE

## 2016-11-15 DIAGNOSIS — K080 Exfoliation of teeth due to systemic causes: Secondary | ICD-10-CM | POA: Diagnosis not present

## 2017-01-09 DIAGNOSIS — M545 Low back pain: Secondary | ICD-10-CM | POA: Diagnosis not present

## 2017-01-09 DIAGNOSIS — M9902 Segmental and somatic dysfunction of thoracic region: Secondary | ICD-10-CM | POA: Diagnosis not present

## 2017-01-09 DIAGNOSIS — M9903 Segmental and somatic dysfunction of lumbar region: Secondary | ICD-10-CM | POA: Diagnosis not present

## 2017-01-09 DIAGNOSIS — M546 Pain in thoracic spine: Secondary | ICD-10-CM | POA: Diagnosis not present

## 2017-02-14 DIAGNOSIS — Z6824 Body mass index (BMI) 24.0-24.9, adult: Secondary | ICD-10-CM | POA: Diagnosis not present

## 2017-02-14 DIAGNOSIS — B370 Candidal stomatitis: Secondary | ICD-10-CM | POA: Diagnosis not present

## 2017-05-23 DIAGNOSIS — K080 Exfoliation of teeth due to systemic causes: Secondary | ICD-10-CM | POA: Diagnosis not present

## 2017-06-01 ENCOUNTER — Other Ambulatory Visit: Payer: Self-pay

## 2017-06-01 ENCOUNTER — Ambulatory Visit (INDEPENDENT_AMBULATORY_CARE_PROVIDER_SITE_OTHER): Payer: Federal, State, Local not specified - PPO | Admitting: Adult Health

## 2017-06-01 ENCOUNTER — Encounter: Payer: Self-pay | Admitting: Adult Health

## 2017-06-01 VITALS — BP 116/74 | HR 83 | Resp 18 | Ht 64.0 in | Wt 147.0 lb

## 2017-06-01 DIAGNOSIS — Z3041 Encounter for surveillance of contraceptive pills: Secondary | ICD-10-CM

## 2017-06-01 DIAGNOSIS — Z01419 Encounter for gynecological examination (general) (routine) without abnormal findings: Secondary | ICD-10-CM

## 2017-06-01 MED ORDER — NORETHIN-ETH ESTRAD-FE BIPHAS 1 MG-10 MCG / 10 MCG PO TABS
1.0000 | ORAL_TABLET | Freq: Every day | ORAL | 4 refills | Status: DC
Start: 2017-06-01 — End: 2018-05-30

## 2017-06-01 NOTE — Progress Notes (Signed)
Patient ID: Shannon Cole, female   DOB: 12/20/1990, 27 y.o.   MRN: [id number] History of Present Illness:  Shannon Cole is a 27 year old white female, single but with partner 6 years, in for well woman gyn exam, she had normal pap 05/31/16. PCP Lubertha SouthSteve Luking.   Current Medications, Allergies, Past Medical History, Past Surgical History, Family History and Social History were reviewed in [facility] electronic medical record.     Review of Systems: Patient denies any headaches, hearing loss, fatigue, blurred vision, shortness of breath, chest pain, abdominal pain, problems with bowel movements, urination, or intercourse. No joint pain or mood swings. Decreased libido and some anxiety at times.    Physical Exam:BP 116/74 (BP Location: Right Arm, Patient Position: Sitting, Cuff Size: Normal)   Pulse 83   Resp 18   Ht 5\' 4"  (1.626 m)   Wt 147 lb (66.7 kg)   LMP 05/01/2017   BMI 25.23 kg/m  General:  Well developed, well nourished, no acute distress Skin:  Warm and dry,multiple tattoos  Neck:  Midline trachea, normal thyroid, good ROM, no lymphadenopathy Lungs; Clear to auscultation bilaterally Breast:  No dominant palpable mass, retraction, or nipple discharge Cardiovascular: Regular rate and rhythm Abdomen:  Soft, non tender, no hepatosplenomegaly Pelvic:  External genitalia is normal in appearance, no lesions.  The vagina is normal in appearance. Urethra has no lesions or masses. The cervix is nulliparous.  Uterus is felt to be normal size, shape, and contour.  No adnexal masses or tenderness noted.Bladder is non tender, no masses felt. Extremities/musculoskeletal:  No swelling or varicosities noted, no clubbing or cyanosis Psych:  No mood changes, alert and cooperative,seems happy PHQ 2 score 0.She declines STD testing.  Impression: 1. Encounter for well woman exam with routine gynecological exam   2. Encounter for surveillance of contraceptive pills       Plan: Finish current  pack of OCs and start lo leostrin given 2 packs, and use condoms Meds ordered this encounter  Medications  . Norethindrone-Ethinyl Estradiol-Fe Biphas (LO LOESTRIN FE) 1 MG-10 MCG / 10 MCG tablet    Sig: Take 1 tablet by mouth daily. Take 1 daily by mouth    Dispense:  3 Package    Refill:  4    BIN [id number], PCN [id number], GRP [id number],ID [id number]    Order Specific Question:   Supervising Provider    Answer:   Lazaro ArmsEURE, LUTHER H [[id number]]  Increase frequency of sex, from once a week to twice a week F/U in 3 months  Physical in 1 week Pap in 2021

## 2017-06-21 ENCOUNTER — Telehealth: Payer: Self-pay | Admitting: Adult Health

## 2017-06-21 NOTE — Telephone Encounter (Signed)
Patient called stating that she was prescribed a new birth control by Victorino DikeJennifer but her insurance will not pay for it. Pt states that she does not want to change her Birth control that is free to that cost money. Pt would like a call back from EdmundJennifer.

## 2017-06-21 NOTE — Telephone Encounter (Signed)
Patient states BCP are $25/month and she cannot afford that. States the previous pills were free but wanted to change because she was hormonal and had decreased sex drive. Has not started the samples yet. Advised patient to check with pharmacy to see if they ran it through with discount card and to let me know. Stated she would call.

## 2017-07-18 ENCOUNTER — Other Ambulatory Visit: Payer: Self-pay | Admitting: Adult Health

## 2017-07-25 ENCOUNTER — Other Ambulatory Visit: Payer: Self-pay | Admitting: *Deleted

## 2017-07-25 MED ORDER — LEVONORGEST-ETH ESTRAD 91-DAY 0.15-0.03 &0.01 MG PO TABS: 1.0000 | ORAL_TABLET | Freq: Every day | ORAL | 4 refills | Status: DC

## 2017-08-29 ENCOUNTER — Ambulatory Visit: Payer: Federal, State, Local not specified - PPO | Admitting: Adult Health

## 2017-12-19 DIAGNOSIS — K080 Exfoliation of teeth due to systemic causes: Secondary | ICD-10-CM | POA: Diagnosis not present

## 2018-05-09 DIAGNOSIS — J00 Acute nasopharyngitis [common cold]: Secondary | ICD-10-CM | POA: Diagnosis not present

## 2018-05-09 DIAGNOSIS — Z6826 Body mass index (BMI) 26.0-26.9, adult: Secondary | ICD-10-CM | POA: Diagnosis not present

## 2018-05-30 ENCOUNTER — Telehealth: Payer: Self-pay | Admitting: Adult Health

## 2018-05-30 MED ORDER — NORETHIN-ETH ESTRAD-FE BIPHAS 1 MG-10 MCG / 10 MCG PO TABS: 1.0000 | ORAL_TABLET | Freq: Every day | ORAL | 4 refills | Status: DC

## 2018-05-30 NOTE — Telephone Encounter (Signed)
Refilled lo loestrin °

## 2018-05-30 NOTE — Telephone Encounter (Signed)
Patient called stating that she would like a refill of her Birth control until her pap and physical appointment on 07/10/2018. Please contact pt when done.

## 2018-06-19 ENCOUNTER — Telehealth: Payer: Self-pay | Admitting: *Deleted

## 2018-06-19 NOTE — Telephone Encounter (Signed)
Pt wondered why she had to pay $25.00 for her birth control. I advised it may be because the start of a new year or something with her insurance. Advised to call pharmacy and ask. Pt voiced understanding. JSY

## 2018-06-26 DIAGNOSIS — K080 Exfoliation of teeth due to systemic causes: Secondary | ICD-10-CM | POA: Diagnosis not present

## 2018-07-02 ENCOUNTER — Telehealth: Payer: Self-pay | Admitting: Adult Health

## 2018-07-02 MED ORDER — NORETHIN-ETH ESTRAD-FE BIPHAS 1 MG-10 MCG / 10 MCG PO TABS: 1.0000 | ORAL_TABLET | Freq: Every day | ORAL | 4 refills | Status: DC

## 2018-07-02 NOTE — Telephone Encounter (Signed)
Called pt to r/s her appointment. She can only come on Tuesday's / R/S for the 17th but will be out of Birth Control can we call into [facility] in [place] which is a new pharmacy she is using so may not be the one in the system

## 2018-07-02 NOTE — Telephone Encounter (Signed)
Refilled OCs 

## 2018-07-03 ENCOUNTER — Other Ambulatory Visit: Payer: Self-pay | Admitting: Adult Health

## 2018-07-10 ENCOUNTER — Other Ambulatory Visit: Payer: Federal, State, Local not specified - PPO | Admitting: Adult Health

## 2018-07-17 ENCOUNTER — Other Ambulatory Visit: Payer: Federal, State, Local not specified - PPO | Admitting: Adult Health

## 2018-07-24 DIAGNOSIS — M546 Pain in thoracic spine: Secondary | ICD-10-CM | POA: Diagnosis not present

## 2018-07-24 DIAGNOSIS — M9903 Segmental and somatic dysfunction of lumbar region: Secondary | ICD-10-CM | POA: Diagnosis not present

## 2018-07-24 DIAGNOSIS — M9902 Segmental and somatic dysfunction of thoracic region: Secondary | ICD-10-CM | POA: Diagnosis not present

## 2018-07-24 DIAGNOSIS — M545 Low back pain: Secondary | ICD-10-CM | POA: Diagnosis not present

## 2018-08-23 ENCOUNTER — Other Ambulatory Visit: Payer: Federal, State, Local not specified - PPO | Admitting: Adult Health

## 2018-10-13 ENCOUNTER — Other Ambulatory Visit: Payer: Self-pay | Admitting: Obstetrics & Gynecology

## 2018-10-19 ENCOUNTER — Telehealth: Payer: Self-pay | Admitting: Adult Health

## 2018-10-19 NOTE — Telephone Encounter (Signed)

## 2018-10-23 ENCOUNTER — Ambulatory Visit (INDEPENDENT_AMBULATORY_CARE_PROVIDER_SITE_OTHER): Payer: Federal, State, Local not specified - PPO | Admitting: Adult Health

## 2018-10-23 ENCOUNTER — Encounter: Payer: Self-pay | Admitting: Adult Health

## 2018-10-23 ENCOUNTER — Other Ambulatory Visit: Payer: Self-pay

## 2018-10-23 VITALS — BP 122/74 | HR 78 | Ht 64.0 in | Wt 162.4 lb

## 2018-10-23 DIAGNOSIS — Z01419 Encounter for gynecological examination (general) (routine) without abnormal findings: Secondary | ICD-10-CM | POA: Insufficient documentation

## 2018-10-23 DIAGNOSIS — F411 Generalized anxiety disorder: Secondary | ICD-10-CM

## 2018-10-23 DIAGNOSIS — Z3041 Encounter for surveillance of contraceptive pills: Secondary | ICD-10-CM | POA: Insufficient documentation

## 2018-10-23 MED ORDER — BUSPIRONE HCL 5 MG PO TABS: 5.0000 mg | ORAL_TABLET | Freq: Two times a day (BID) | ORAL | 6 refills | Status: DC

## 2018-10-23 MED ORDER — LEVONORGEST-ETH ESTRAD 91-DAY 0.15-0.03 &0.01 MG PO TABS: 1.0000 | ORAL_TABLET | Freq: Every day | ORAL | 4 refills | Status: DC

## 2018-10-23 NOTE — Progress Notes (Signed)
Patient ID: Shannon Cole, female   DOB: 1991/01/19, 28 y.o.   MRN: [id number] History of Present Illness: Shannon Cole 28 year old white female, G0P0, engaged in for a well woman gyn exam, she had a normal pap 05/31/16. PCP is Mickie Hillier.   Current Medications, Allergies, Past Medical History, Past Surgical History, Family History and Social History were reviewed in [employer] record.     Review of Systems: Patient denies any headaches, hearing loss, blurred vision, shortness of breath, chest pain, abdominal pain, problems with bowel movements, urination, or intercourse. No joint pain or mood swings. +tired, wants to sleep, has anxiety and is teary at times. She is happy with her OCs.    Physical Exam:BP 122/74 (BP Location: Left Arm, Patient Position: Sitting, Cuff Size: Normal)   Pulse 78   Ht 5\' 4"  (1.626 m)   Wt 162 lb 6.4 oz (73.7 kg)   BMI 27.88 kg/m  General:  Well developed, well nourished, no acute distress Skin:  Warm and dry Neck:  Midline trachea, normal thyroid, good ROM, no lymphadenopathy Lungs; Clear to auscultation bilaterally Breast:  No dominant palpable mass, retraction, or nipple discharge Cardiovascular: Regular rate and rhythm Abdomen:  Soft, non tender, no hepatosplenomegaly Pelvic:  External genitalia is normal in appearance, no lesions.  The vagina is normal in appearance. Urethra has no lesions or masses. The cervix is nulliparous.  Uterus is felt to be normal size, shape, and contour.  No adnexal masses or tenderness noted.Bladder is non tender, no masses felt.She declines STD testing. Extremities/musculoskeletal:  No swelling or varicosities noted, no clubbing or cyanosis, +tattoos Psych:  No mood changes, alert and cooperative,seems happy Fall risk is low PHQ 9 score 2, denies being suicidal, and is open to trying meds, tried Zoloft once and did not like it. Will try buspar.  Examination chaperoned by Diona Fanti  CMA.  Impression: 1. Encounter for well woman exam with routine gynecological exam   2. Encounter for surveillance of contraceptive pills   3. Generalized anxiety disorder       Plan: Meds ordered this encounter  Medications  . Levonorgestrel-Ethinyl Estradiol (AMETHIA) 0.15-0.03 &0.01 MG tablet    Sig: Take 1 tablet by mouth daily.    Dispense:  91 tablet    Refill:  4    Order Specific Question:   Supervising Provider    Answer:   Elonda Husky, LUTHER H [[id number]]  . busPIRone (BUSPAR) 5 MG tablet    Sig: Take 1 tablet (5 mg total) by mouth 2 (two) times daily.    Dispense:  60 tablet    Refill:  6    Order Specific Question:   Supervising Provider    Answer:   Tania Ade H [[id number]]   Follow up in 6 weeks for tele visit on taking buspar  Pap and physical in 1 year Call me when wants to do fasting labs.

## 2018-10-25 ENCOUNTER — Telehealth: Payer: Self-pay | Admitting: Adult Health

## 2018-10-25 NOTE — Telephone Encounter (Signed)
Pt was started on Buspar. Pt wants to know if she can drink alcohol with this med. Please advise. Thanks!! [place]

## 2018-10-25 NOTE — Telephone Encounter (Signed)
Pt requesting to speak with a nurse regarding questions she has about busPIRone (BUSPAR) 5 MG tablet

## 2018-10-26 NOTE — Telephone Encounter (Signed)
Pt to afford alcohol and large amounts of grapefruit juice with Buspar

## 2018-11-16 ENCOUNTER — Other Ambulatory Visit: Payer: Self-pay | Admitting: Adult Health

## 2018-12-03 ENCOUNTER — Telehealth: Payer: Self-pay | Admitting: Adult Health

## 2018-12-03 NOTE — Telephone Encounter (Signed)
Unable to reach pt with restrictions.  

## 2018-12-04 ENCOUNTER — Ambulatory Visit: Payer: Federal, State, Local not specified - PPO | Admitting: Adult Health

## 2019-02-25 ENCOUNTER — Telehealth: Payer: Self-pay | Admitting: *Deleted

## 2019-02-25 NOTE — Telephone Encounter (Signed)
Pt wants someone to call her to discuss stopping her birth control.

## 2019-02-26 NOTE — Telephone Encounter (Signed)
I returned her call. She wanted to know if she could just stop her birth control or exactly how to do it. I advised that when she gets to the end of her pack just don't start a new pack. Patient agreeable and no other questions.

## 2019-06-03 ENCOUNTER — Encounter: Payer: Self-pay | Admitting: Family Medicine

## 2020-06-30 DIAGNOSIS — F411 Generalized anxiety disorder: Secondary | ICD-10-CM | POA: Diagnosis not present

## 2020-07-15 DIAGNOSIS — F411 Generalized anxiety disorder: Secondary | ICD-10-CM | POA: Diagnosis not present

## 2020-07-22 DIAGNOSIS — F411 Generalized anxiety disorder: Secondary | ICD-10-CM | POA: Diagnosis not present

## 2020-08-04 DIAGNOSIS — F411 Generalized anxiety disorder: Secondary | ICD-10-CM | POA: Diagnosis not present

## 2020-09-08 ENCOUNTER — Other Ambulatory Visit: Payer: Self-pay

## 2020-09-08 ENCOUNTER — Other Ambulatory Visit (HOSPITAL_COMMUNITY)
Admission: RE | Admit: 2020-09-08 | Discharge: 2020-09-08 | Disposition: A | Discharge status: Home / Self Care | Payer: Federal, State, Local not specified - PPO | Source: Ambulatory Visit | Attending: Obstetrics & Gynecology | Admitting: Obstetrics & Gynecology

## 2020-09-08 ENCOUNTER — Other Ambulatory Visit (INDEPENDENT_AMBULATORY_CARE_PROVIDER_SITE_OTHER): Payer: Federal, State, Local not specified - PPO

## 2020-09-08 DIAGNOSIS — Z3202 Encounter for pregnancy test, result negative: Secondary | ICD-10-CM

## 2020-09-08 DIAGNOSIS — N926 Irregular menstruation, unspecified: Secondary | ICD-10-CM | POA: Insufficient documentation

## 2020-09-08 DIAGNOSIS — Z113 Encounter for screening for infections with a predominantly sexual mode of transmission: Secondary | ICD-10-CM | POA: Insufficient documentation

## 2020-09-08 DIAGNOSIS — F411 Generalized anxiety disorder: Secondary | ICD-10-CM | POA: Diagnosis not present

## 2020-09-08 LAB — POCT URINE PREGNANCY: Preg Test, Ur: NEGATIVE

## 2020-09-08 NOTE — Progress Notes (Signed)
   NURSE VISIT- VAGINITIS/STD/POC  SUBJECTIVE:  Shannon Cole is a 30 y.o. G0P0000 GYN patientfemale here for a vaginal swab for vaginitis screening, STD screen.  She reports the following symptoms: abnormal bleeding: Regular period, started bleeding again after 4 days for 1 week. Denies abnormal vaginal bleeding, significant pelvic pain, fever, or UTI symptoms.  OBJECTIVE:  LMP 08/25/2020 (Exact Date)   Appears well, in no apparent distress  ASSESSMENT: Vaginal swab for vaginitis & STD screening  PLAN: Self-collected vaginal probe for Gonorrhea, Chlamydia, Trichomonas, Bacterial Vaginosis, Yeast sent to lab Treatment: to be determined once results are received Follow-up as needed if symptoms persist/worsen, or new symptoms develop    NURSE VISIT- PREGNANCY CONFIRMATION   SUBJECTIVE:  Shannon Cole is a 30 y.o. G0P0000 female at Unknown by certain LMP of Patient's last menstrual period was 08/25/2020 (exact date). Here for pregnancy confirmation.  Home pregnancy test: not taken  She reports cramping.  She is not taking prenatal vitamins.    OBJECTIVE:  LMP 08/25/2020 (Exact Date)   Appears well, in no apparent distress OB History  Gravida Para Term Preterm AB Living  0 0 0 0 0 0  SAB IAB Ectopic Multiple Live Births  0 0 0 0      Results for orders placed or performed in visit on 09/08/20 (from the past 24 hour(s))  POCT urine pregnancy   Collection Time: 09/08/20  3:53 PM  Result Value Ref Range   Preg Test, Ur Negative Negative    ASSESSMENT: Negative pregnancy test    PLAN: Follow-up PRN  Jory Welke A Ziere Docken  09/08/2020 3:58 PM

## 2020-09-08 NOTE — Progress Notes (Signed)
Chart reviewed for nurse visit. Agree with plan of care.  Adline Potter, NP 09/08/2020 4:57 PM

## 2020-09-10 LAB — CERVICOVAGINAL ANCILLARY ONLY
Bacterial Vaginitis (gardnerella): NEGATIVE
Candida Glabrata: NEGATIVE
Candida Vaginitis: NEGATIVE
Chlamydia: NEGATIVE
Comment: NEGATIVE
Comment: NEGATIVE
Comment: NEGATIVE
Comment: NEGATIVE
Comment: NEGATIVE
Comment: NORMAL
Neisseria Gonorrhea: NEGATIVE
Trichomonas: NEGATIVE

## 2020-11-13 ENCOUNTER — Encounter: Payer: Self-pay | Admitting: Emergency Medicine

## 2020-11-13 ENCOUNTER — Ambulatory Visit
Admission: EM | Admit: 2020-11-13 | Discharge: 2020-11-13 | Disposition: A | Discharge status: Home / Self Care | Payer: Federal, State, Local not specified - PPO | Attending: Emergency Medicine | Admitting: Emergency Medicine

## 2020-11-13 ENCOUNTER — Other Ambulatory Visit: Payer: Self-pay

## 2020-11-13 DIAGNOSIS — Z20822 Contact with and (suspected) exposure to covid-19: Secondary | ICD-10-CM | POA: Diagnosis not present

## 2020-11-13 MED ORDER — AZITHROMYCIN 250 MG PO TABS: 250.0000 mg | ORAL_TABLET | Freq: Every day | ORAL | 0 refills | Status: DC

## 2020-11-13 MED ORDER — CETIRIZINE-PSEUDOEPHEDRINE ER 5-120 MG PO TB12: 1.0000 | ORAL_TABLET | Freq: Every day | ORAL | 0 refills | Status: DC

## 2020-11-13 NOTE — ED Triage Notes (Addendum)
Pt here with sore throat, headache, body aches, x 4 days. OTC meds do not offer relief. Denies fever.

## 2020-11-13 NOTE — Discharge Instructions (Addendum)
COVID testing ordered.  It will take between 5-7 days for test results.  Someone will contact you regarding abnormal results.    In the meantime: You should remain isolated in your home for 5 days from symptom onset AND greater than 72 hours after symptoms resolution (absence of fever without the use of fever-reducing medication and improvement in respiratory symptoms), whichever is longer Get plenty of rest and push fluids Use OTC zyrtec for nasal congestion, runny nose, and/or sore throat Use OTC flonase for nasal congestion and runny nose Use medications daily for symptom relief Use OTC medications like ibuprofen or tylenol as needed fever or pain Call or go to the ED if you have any new or worsening symptoms such as fever, cough, shortness of breath, chest tightness, chest pain, turning blue, changes in mental status, etc...  

## 2020-11-13 NOTE — ED Provider Notes (Signed)
[facility]   [id number] 11/13/20 Arrival Time: 0805   CC: COVID symptoms  SUBJECTIVE: History from: patient.  CARIN SHIPP is a 30 y.o. female who presents with sore throat, headache, body aches x 4 days.  Denies sick exposure to COVID, flu or strep.  Has tried OTC medications without relief.  Symptoms are made worse with swallowing, but tolerating own secretions and liquids.  Reports previous symptoms in the past.   Denies fever, chills, SOB, wheezing, chest pain, nausea, changes in bowel or bladder habits.     ROS: As per HPI.  All other pertinent ROS negative.     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   No current outpatient medications on file prior to encounter.   Social History   Socioeconomic History   Marital status: Single    Spouse name: Not on file   Number of children: 0   Years of education: Not on file   Highest education level: Not on file  Occupational History   Not on file  Tobacco Use   Smoking status: Never   Smokeless tobacco: Never  Vaping Use   Vaping Use: Never used  Substance and Sexual Activity   Alcohol use: Yes    Comment: socially   Drug use: No   Sexual activity: Yes    Birth control/protection: Pill  Other Topics Concern   Not on file  Social History Narrative   Not on file   Social Determinants of Health   Financial Resource Strain: Not on file  Food Insecurity: Not on file  Transportation Needs: Not on file  Physical Activity: Not on file   Stress: Not on file  Social Connections: Not on file  Intimate Partner Violence: Not on file   Family History  Problem Relation Age of Onset   Diabetes Mother    Cancer Father        lung    OBJECTIVE:  Vitals:   11/13/20 0815  BP: 121/87  Pulse: (!) 103  Resp: 20  Temp: 98.9 F (37.2 C)  TempSrc: Oral  SpO2: 97%  Weight: 140 lb (63.5 kg)  Height: 5\' 5"  (1.651 m)    General appearance: alert; appears fatigued, but nontoxic; speaking in full sentences and tolerating own secretions HEENT: NCAT; Ears: EACs clear, RT TM pearly gray, LT TM erythematous; Eyes: PERRL.  EOM grossly intact. Nose: nares patent without rhinorrhea, Throat: oropharynx clear, tonsils non erythematous or enlarged, uvula midline  Neck: supple without LAD Lungs: unlabored respirations, symmetrical air entry; cough: absent; no respiratory distress; CTAB Heart: regular rate and rhythm.   Skin: warm and dry Psychological: alert and cooperative; normal mood and affect  ASSESSMENT & PLAN:  1. Exposure to COVID-19 virus     Meds ordered this encounter  Medications   cetirizine-pseudoephedrine (ZYRTEC-D) 5-120 MG tablet    Sig: Take 1 tablet by mouth daily.    Dispense:  30 tablet    Refill:  0    Order Specific Question:   Supervising Provider    Answer:  Rica Mast SUE [[id number]]   azithromycin (ZITHROMAX) 250 MG tablet    Sig: Take 1 tablet (250 mg total) by mouth daily. Take first 2 tablets together, then 1 every day until finished.    Dispense:  6 tablet    Refill:  0    Order Specific Question:   Supervising Provider    Answer:   Eustace Moore [[id number]]   COVID testing ordered.  It will take between 5-7 days for test results.  Someone will contact you regarding abnormal results.    In the meantime: You should remain isolated in your home for 5 days from symptom onset AND greater than 72 hours after symptoms resolution (absence of fever without the use of fever-reducing medication and  improvement in respiratory symptoms), whichever is longer Get plenty of rest and push fluids Use OTC zyrtec for nasal congestion, runny nose, and/or sore throat Use OTC flonase for nasal congestion and runny nose Use medications daily for symptom relief Use OTC medications like ibuprofen or tylenol as needed fever or pain Call or go to the ED if you have any new or worsening symptoms such as fever, cough, shortness of breath, chest tightness, chest pain, turning blue, changes in mental status, etc...   Reviewed expectations re: course of current medical issues. Questions answered. Outlined signs and symptoms indicating need for more acute intervention. Patient verbalized understanding. After Visit Summary given.          Rennis Harding, PA-C 11/13/20 [phone]

## 2020-11-14 LAB — COVID-19, FLU A+B NAA
Influenza A, NAA: NOT DETECTED
Influenza B, NAA: NOT DETECTED
SARS-CoV-2, NAA: DETECTED — AB

## 2021-04-23 ENCOUNTER — Emergency Department (HOSPITAL_COMMUNITY): Payer: Federal, State, Local not specified - PPO

## 2021-04-23 ENCOUNTER — Other Ambulatory Visit: Payer: Self-pay

## 2021-04-23 ENCOUNTER — Emergency Department (HOSPITAL_COMMUNITY)
Admission: EM | Admit: 2021-04-23 | Discharge: 2021-04-23 | Discharge status: Against Medical Advice | Payer: Federal, State, Local not specified - PPO | Attending: Emergency Medicine | Admitting: Emergency Medicine

## 2021-04-23 DIAGNOSIS — R079 Chest pain, unspecified: Secondary | ICD-10-CM | POA: Insufficient documentation

## 2021-04-23 DIAGNOSIS — Z5321 Procedure and treatment not carried out due to patient leaving prior to being seen by health care provider: Secondary | ICD-10-CM | POA: Diagnosis not present

## 2021-04-23 DIAGNOSIS — N9489 Other specified conditions associated with female genital organs and menstrual cycle: Secondary | ICD-10-CM | POA: Insufficient documentation

## 2021-04-23 LAB — BASIC METABOLIC PANEL
Anion gap: 8 (ref 5–15)
BUN: 16 mg/dL (ref 6–20)
CO2: 21 mmol/L — ABNORMAL LOW (ref 22–32)
Calcium: 9.6 mg/dL (ref 8.9–10.3)
Chloride: 105 mmol/L (ref 98–111)
Creatinine, Ser: 0.93 mg/dL (ref 0.44–1.00)
GFR, Estimated: >60 mL/min (ref >60)
Glucose, Bld: 101 mg/dL — ABNORMAL HIGH (ref 70–99)
Potassium: 3.8 mmol/L (ref 3.5–5.1)
Sodium: 134 mmol/L — ABNORMAL LOW (ref 135–145)

## 2021-04-23 LAB — CBC
HCT: 39.1 % (ref 36.0–46.0)
Hemoglobin: 13.3 g/dL (ref 12.0–15.0)
MCH: 29.7 pg (ref 26.0–34.0)
MCHC: 34 g/dL (ref 30.0–36.0)
MCV: 87.3 fL (ref 80.0–100.0)
Platelets: 342 10*3/uL (ref 150–400)
RBC: 4.48 MIL/uL (ref 3.87–5.11)
RDW: 11.9 % (ref 11.5–15.5)
WBC: 10.8 10*3/uL — ABNORMAL HIGH (ref 4.0–10.5)
nRBC: 0 % (ref 0.0–0.2)

## 2021-04-23 LAB — TROPONIN I (HIGH SENSITIVITY)
Troponin I (High Sensitivity): <2 ng/L (ref <18)
Troponin I (High Sensitivity): 3 ng/L (ref <18)

## 2021-04-23 LAB — I-STAT BETA HCG BLOOD, ED (MC, WL, AP ONLY): I-stat hCG, quantitative: <5 m[IU]/mL (ref <5)

## 2021-04-23 NOTE — ED Notes (Signed)
The patient informed this EMT that the adhesive in the EKG electrodes were irritating her skin. This EMT observed reddened skin at the application site. This EMT informed the patient that she remove the electrodes and inform her care team of the irritation.

## 2021-04-23 NOTE — ED Notes (Signed)
This patient told this EMT that she was a 0 out of 10 on the pain scale and had her results on MyChart. This patient decided to leave without being seen by a provider. This EMT informed the patient that if her symptoms returned or became worse to seek medical attention.

## 2021-04-23 NOTE — ED Triage Notes (Signed)
Pt. Stated, I started having chest pain last night around 1230 and it continued all night . I do have anxiety but this Ive never had. Denies any other symptoms.

## 2021-04-29 ENCOUNTER — Ambulatory Visit: Payer: Federal, State, Local not specified - PPO | Admitting: Family Medicine

## 2021-05-14 ENCOUNTER — Ambulatory Visit (INDEPENDENT_AMBULATORY_CARE_PROVIDER_SITE_OTHER): Payer: Federal, State, Local not specified - PPO | Admitting: Family Medicine

## 2021-05-14 ENCOUNTER — Other Ambulatory Visit: Payer: Self-pay

## 2021-05-14 ENCOUNTER — Encounter: Payer: Self-pay | Admitting: Family Medicine

## 2021-05-14 DIAGNOSIS — F419 Anxiety disorder, unspecified: Secondary | ICD-10-CM | POA: Diagnosis not present

## 2021-05-14 MED ORDER — CLONAZEPAM 0.5 MG PO TABS: 0.5000 mg | ORAL_TABLET | Freq: Two times a day (BID) | ORAL | 1 refills | Status: DC | PRN

## 2021-05-14 NOTE — Patient Instructions (Signed)
You should receive a call about the referral.  Medication as directed.  Follow up in 6 weeks.

## 2021-05-14 NOTE — Progress Notes (Signed)
Subjective:  Patient ID: Shannon Cole, female    DOB: [birth year]  Age: 31 y.o. MRN: [id number]  CC: Chief Complaint  Patient presents with   Panic Attack    Pain in sternum and its hard to breathe- Started 3 to 4 months ago- reports 3 panic attacks, - usually wake her up at night - went to ER for the 2nd attack which lasted about 8 hrs , Last panic attack 3 nights  ago     HPI:  31 year old female presents for evaluation of the above.  Patient reports that she has generalized anxiety and has had anxiety about health for quite some time.  Over the past 3 months or so she has had periodic episodes of chest pain.  Located in the central chest.  Associated shortness of breath, feeling anxious, and feeling as if she is going to die.  First episode lasted briefly.  Subsequent episodes have lasted for hours.  She was seen in the ER on 12/23 after developing chest pain.  She left without being seen due to the amount of time she had weight.  EKG was obtained.  I have reviewed her EKG today.  EKG was normal.  She had laboratory studies done which I reviewed today as well.  Normal troponin.  Also had a chest x-ray which was reviewed today and was normal.  Patient believes that she is experiencing panic attacks and that this is the culprit for her symptoms.  She is here to discuss this and possible treatment options today.  GAD-7 score of 11.  Patient Active Problem List   Diagnosis Date Noted   Anxiety 05/14/2021   PTSD (post-traumatic stress disorder) 08/18/2015   Allergic rhinitis 12/17/2014    Social Hx   Social History   Socioeconomic History   Marital status: Single    Spouse name: Not on file   Number of children: 0   Years of education: Not on file   Highest education level: Not on file  Occupational History   Not on file  Tobacco Use   Smoking status: Never   Smokeless tobacco: Never  Vaping Use   Vaping Use: Never used  Substance and Sexual Activity   Alcohol use: Yes     Comment: socially   Drug use: No   Sexual activity: Yes    Birth control/protection: Pill  Other Topics Concern   Not on file  Social History Narrative   Not on file   Social Determinants of Health   Financial Resource Strain: Not on file  Food Insecurity: Not on file  Transportation Needs: Not on file  Physical Activity: Not on file  Stress: Not on file  Social Connections: Not on file    Review of Systems Per HPI  Objective:  BP 107/70    Pulse 71    Temp 98.5 F (36.9 C)    Ht 5\' 5"  (1.651 m)    Wt 148 lb (67.1 kg)    SpO2 98%    BMI 24.63 kg/m   BP/Weight 05/14/2021 04/23/2021 11/13/2020  Systolic BP 107 106 121  Diastolic BP 70 77 87  Wt. (Lbs) 148 - 140  BMI 24.63 - 23.3    Physical Exam Vitals and nursing note reviewed.  Constitutional:      General: She is not in acute distress.    Appearance: Normal appearance. She is not ill-appearing.  HENT:     Head: Normocephalic and atraumatic.  Eyes:  General:        Right eye: No discharge.        Left eye: No discharge.     Conjunctiva/sclera: Conjunctivae normal.  Cardiovascular:     Rate and Rhythm: Normal rate and regular rhythm.  Pulmonary:     Effort: Pulmonary effort is normal.     Breath sounds: Normal breath sounds. No wheezing, rhonchi or rales.  Neurological:     Mental Status: She is alert.  Psychiatric:     Comments: Anxious.    Lab Results  Component Value Date   WBC 10.8 (H) 04/23/2021   HGB 13.3 04/23/2021   HCT 39.1 04/23/2021   PLT 342 04/23/2021   GLUCOSE 101 (H) 04/23/2021   ALT 8 07/03/2013   AST 13 07/03/2013   NA 134 (L) 04/23/2021   K 3.8 04/23/2021   CL 105 04/23/2021   CREATININE 0.93 04/23/2021   BUN 16 04/23/2021   CO2 21 (L) 04/23/2021   Review of EKG in December: Interpretation -normal sinus rhythm with sinus arrhythmia.  Normal axis.  Normal intervals.  No ST or T wave changes.  Normal EKG.  Assessment & Plan:   Problem List Items Addressed This Visit        Other   Anxiety    Patient experiencing worsening anxiety which is likely panic attacks.  I have reviewed her recent ER visit as well as the EKG, chest x-ray, and labs.  We had a lengthy discussion today about her being low risk for cardiac chest pain and the fact that I believe that she is experiencing worsening anxiety and panic.  After discussion about therapeutic options, we elected to proceed with referral to psychology for therapy.  Also using clonazepam as needed.  Patient did not want daily pharmacotherapy.      Relevant Orders   Ambulatory referral to Behavioral Health    Meds ordered this encounter  Medications   clonazePAM (KLONOPIN) 0.5 MG tablet    Sig: Take 1 tablet (0.5 mg total) by mouth 2 (two) times daily as needed for anxiety.    Dispense:  20 tablet    Refill:  1    Follow-up:  Return in about 6 weeks (around 06/25/2021).  Everlene Other DO [facility]

## 2021-05-14 NOTE — Assessment & Plan Note (Signed)
Patient experiencing worsening anxiety which is likely panic attacks.  I have reviewed her recent ER visit as well as the EKG, chest x-ray, and labs.  We had a lengthy discussion today about her being low risk for cardiac chest pain and the fact that I believe that she is experiencing worsening anxiety and panic.  After discussion about therapeutic options, we elected to proceed with referral to psychology for therapy.  Also using clonazepam as needed.  Patient did not want daily pharmacotherapy.

## 2021-06-25 ENCOUNTER — Ambulatory Visit: Payer: Federal, State, Local not specified - PPO | Admitting: Family Medicine

## 2021-06-28 ENCOUNTER — Other Ambulatory Visit: Payer: Self-pay

## 2021-06-28 ENCOUNTER — Ambulatory Visit (INDEPENDENT_AMBULATORY_CARE_PROVIDER_SITE_OTHER): Payer: Federal, State, Local not specified - PPO | Admitting: Family Medicine

## 2021-06-28 DIAGNOSIS — F419 Anxiety disorder, unspecified: Secondary | ICD-10-CM

## 2021-06-28 NOTE — Patient Instructions (Signed)
Continue the klonopin as needed.  Follow up in 6 months.  Call/message with concerns.  Take care  Dr. Adriana Simas

## 2021-06-28 NOTE — Progress Notes (Signed)
Subjective:  Patient ID: Shannon Cole, female    DOB: October 12, 1990  Age: 31 y.o. MRN: [id number]  CC: Chief Complaint  Patient presents with   Anxiety    Follow up. Doing good on medication.    HPI:  31 year old female presents for follow-up of the above.  Still battling with anxiety.  GAD-7 score of 9 today.  She states that the [place] works well.  Does make her tired and sedated.  However, she does not take it very often.  She is not on a daily medication.  Has not seen a therapist recently.  Having moderate difficulty.  No other complaints at this time.   Patient Active Problem List   Diagnosis Date Noted   Anxiety 05/14/2021   PTSD (post-traumatic stress disorder) 08/18/2015   Allergic rhinitis 12/17/2014    Social Hx   Social History   Socioeconomic History   Marital status: Single    Spouse name: Not on file   Number of children: 0   Years of education: Not on file   Highest education level: Not on file  Occupational History   Not on file  Tobacco Use   Smoking status: Never   Smokeless tobacco: Never  Vaping Use   Vaping Use: Never used  Substance and Sexual Activity   Alcohol use: Yes    Comment: socially   Drug use: No   Sexual activity: Yes    Birth control/protection: Pill  Other Topics Concern   Not on file  Social History Narrative   Not on file   Social Determinants of Health   Financial Resource Strain: Not on file  Food Insecurity: Not on file  Transportation Needs: Not on file  Physical Activity: Not on file  Stress: Not on file  Social Connections: Not on file    Review of Systems  Constitutional: Negative.   Psychiatric/Behavioral:  The patient is nervous/anxious.   Per HPI  Objective:  BP 112/70    Pulse 83    Temp 98.3 F (36.8 C) (Oral)    Ht 5\' 5"  (1.651 m)    Wt 148 lb (67.1 kg)    SpO2 97%    BMI 24.63 kg/m   BP/Weight 06/28/2021 05/14/2021 [id number]  Systolic BP [id number]  Diastolic BP 70 70 77  Wt. (Lbs) 148  148 -  BMI 24.63 24.63 -    Physical Exam Vitals and nursing note reviewed.  Constitutional:      General: She is not in acute distress.    Appearance: Normal appearance.  Cardiovascular:     Rate and Rhythm: Normal rate and regular rhythm.  Pulmonary:     Effort: Pulmonary effort is normal.     Breath sounds: Normal breath sounds. No wheezing, rhonchi or rales.  Neurological:     Mental Status: She is alert.  Psychiatric:        Mood and Affect: Mood normal.        Behavior: Behavior normal.    Lab Results  Component Value Date   WBC 10.8 (H) 04/23/2021   HGB 13.3 04/23/2021   HCT 39.1 04/23/2021   PLT 342 04/23/2021   GLUCOSE 101 (H) 04/23/2021   ALT 8 07/03/2013   AST 13 07/03/2013   NA 134 (L) 04/23/2021   K 3.8 04/23/2021   CL 105 04/23/2021   CREATININE 0.93 04/23/2021   BUN 16 04/23/2021   CO2 21 (L) 04/23/2021     Assessment & Plan:   Problem  List Items Addressed This Visit       Other   Anxiety    Stable.  Klonopin works well when used.  She has had slight improvement.  She is not ready for daily medication.  Will continue Klonopin.  Supportive care.      Follow-up:  Return in about 6 months (around 12/26/2021).  [place]

## 2021-06-28 NOTE — Assessment & Plan Note (Signed)
Stable.  Klonopin works well when used.  She has had slight improvement.  She is not ready for daily medication.  Will continue Klonopin.  Supportive care.

## 2021-12-09 ENCOUNTER — Encounter: Payer: Self-pay | Admitting: Family Medicine

## 2021-12-09 ENCOUNTER — Ambulatory Visit (HOSPITAL_COMMUNITY)
Admission: RE | Admit: 2021-12-09 | Discharge: 2021-12-09 | Disposition: A | Discharge status: Home / Self Care | Payer: Federal, State, Local not specified - PPO | Source: Ambulatory Visit | Attending: Family Medicine | Admitting: Family Medicine

## 2021-12-09 ENCOUNTER — Ambulatory Visit: Payer: Federal, State, Local not specified - PPO | Admitting: Family Medicine

## 2021-12-09 VITALS — BP 108/67 | HR 91 | Temp 98.6°F | Wt 155.6 lb

## 2021-12-09 DIAGNOSIS — R1013 Epigastric pain: Secondary | ICD-10-CM

## 2021-12-09 DIAGNOSIS — K8020 Calculus of gallbladder without cholecystitis without obstruction: Secondary | ICD-10-CM | POA: Diagnosis not present

## 2021-12-09 MED ORDER — CLONAZEPAM 0.5 MG PO TABS: 0.5000 mg | ORAL_TABLET | Freq: Two times a day (BID) | ORAL | 1 refills | Status: AC | PRN

## 2021-12-09 MED ORDER — PANTOPRAZOLE SODIUM 40 MG PO TBEC: 40.0000 mg | DELAYED_RELEASE_TABLET | Freq: Every day | ORAL | 3 refills | Status: DC

## 2021-12-09 NOTE — Progress Notes (Signed)
Subjective:  Patient ID: Shannon Cole, female    DOB: 05/15/1990  Age: 31 y.o. MRN: [id number]  CC: Chief Complaint  Patient presents with   Abdominal Pain    Abdominal pain for about 10 months-random but mostly happens in the middle of the night. Pain is directly in middle of chest; at bottom of sternum.     HPI:  31 year old female presents for evaluation the above.  Patient reports that she has had pain in the upper abdomen and the lower part of her sternum intermittently for many months.  She states that this seems to occur randomly but tends to occur at night particular after she has eaten a fatty meal.  She denies any right upper quadrant pain.  However, she is concerned that she has gallbladder issue.  No significant heartburn.  States that Tums does not alleviate her discomfort.  She states that when it comes it lasts 1 to 7 hours and then subsequently resolves.  No reports of vomiting.  No other associated symptoms.  No other complaints.  Patient Active Problem List   Diagnosis Date Noted   Epigastric pain 12/09/2021   Anxiety 05/14/2021   PTSD (post-traumatic stress disorder) 08/18/2015   Allergic rhinitis 12/17/2014    Social Hx   Social History   Socioeconomic History   Marital status: Single    Spouse name: Not on file   Number of children: 0   Years of education: Not on file   Highest education level: Not on file  Occupational History   Not on file  Tobacco Use   Smoking status: Never   Smokeless tobacco: Never  Vaping Use   Vaping Use: Never used  Substance and Sexual Activity   Alcohol use: Yes    Comment: socially   Drug use: No   Sexual activity: Yes    Birth control/protection: Pill  Other Topics Concern   Not on file  Social History Narrative   Not on file   Social Determinants of Health   Financial Resource Strain: Not on file  Food Insecurity: Not on file  Transportation Needs: Not on file  Physical Activity: Not on file  Stress:  Not on file  Social Connections: Not on file    Review of Systems Per HPI  Objective:  BP 108/67   Pulse 91   Temp 98.6 F (37 C)   Wt 155 lb 9.6 oz (70.6 kg)   SpO2 98%   BMI 25.89 kg/m      12/09/2021   11:28 AM 06/28/2021    1:10 PM 05/14/2021    9:34 AM  BP/Weight  Systolic BP 983 382 505  Diastolic BP 67 70 70  Wt. (Lbs) 155.6 148 148  BMI 25.89 kg/m2 24.63 kg/m2 24.63 kg/m2    Physical Exam Vitals and nursing note reviewed.  Constitutional:      General: She is not in acute distress.    Appearance: She is well-developed.  HENT:     Head: Normocephalic and atraumatic.  Eyes:     General:        Right eye: No discharge.        Left eye: No discharge.  Cardiovascular:     Rate and Rhythm: Normal rate and regular rhythm.  Pulmonary:     Effort: Pulmonary effort is normal.     Breath sounds: Normal breath sounds. No wheezing, rhonchi or rales.  Abdominal:     General: There is no distension.  Palpations: Abdomen is soft.     Tenderness: There is no abdominal tenderness.  Neurological:     Mental Status: She is alert.     Lab Results  Component Value Date   WBC 10.8 (H) 04/23/2021   HGB 13.3 04/23/2021   HCT 39.1 04/23/2021   PLT 342 04/23/2021   GLUCOSE 101 (H) 04/23/2021   ALT 8 07/03/2013   AST 13 07/03/2013   NA 134 (L) 04/23/2021   K 3.8 04/23/2021   CL 105 04/23/2021   CREATININE 0.93 04/23/2021   BUN 16 04/23/2021   CO2 21 (L) 04/23/2021     Assessment & Plan:   Problem List Items Addressed This Visit       Other   Epigastric pain - Primary    Patient endorsing pain in the epigastric area and lower part of the sternum.  Given association with fatty meals, there is potential for cholelithiasis to be the cause.  Ultrasound was obtained today and did reveal tiny gallstones.  Clinically, I do not feel that gallstones is the culprit of her discomfort.  Trial of Protonix.  Watch alcohol and food intake.  If continues to persist and occur  we will proceed with general surgery referral.      Relevant Orders   US Abdomen Limited RUQ (LIVER/GB) (Completed)   CBC   CMP14+EGFR   Lipase    Meds ordered this encounter  Medications   clonazePAM (KLONOPIN) 0.5 MG tablet    Sig: Take 1 tablet (0.5 mg total) by mouth 2 (two) times daily as needed for anxiety.    Dispense:  20 tablet    Refill:  1   pantoprazole (PROTONIX) 40 MG tablet    Sig: Take 1 tablet (40 mg total) by mouth daily.    Dispense:  30 tablet    Refill:  [place]

## 2021-12-09 NOTE — Assessment & Plan Note (Signed)
Patient endorsing pain in the epigastric area and lower part of the sternum.  Given association with fatty meals, there is potential for cholelithiasis to be the cause.  Ultrasound was obtained today and did reveal tiny gallstones.  Clinically, I do not feel that gallstones is the culprit of her discomfort.  Trial of Protonix.  Watch alcohol and food intake.  If continues to persist and occur we will proceed with general surgery referral.

## 2021-12-09 NOTE — Patient Instructions (Signed)
Medication as prescribed.  We will call with results.  Avoid food triggers.  Take care  Dr. Adriana Simas

## 2021-12-10 LAB — CMP14+EGFR
ALT: 190 IU/L — ABNORMAL HIGH (ref 0–32)
AST: 386 IU/L — ABNORMAL HIGH (ref 0–40)
Albumin/Globulin Ratio: 1.7 (ref 1.2–2.2)
Albumin: 4.7 g/dL (ref 4.0–5.0)
Alkaline Phosphatase: 124 IU/L — ABNORMAL HIGH (ref 44–121)
BUN/Creatinine Ratio: 15 (ref 9–23)
BUN: 12 mg/dL (ref 6–20)
Bilirubin Total: 0.4 mg/dL (ref 0.0–1.2)
CO2: 21 mmol/L (ref 20–29)
Calcium: 10.3 mg/dL — ABNORMAL HIGH (ref 8.7–10.2)
Chloride: 101 mmol/L (ref 96–106)
Creatinine, Ser: 0.79 mg/dL (ref 0.57–1.00)
Globulin, Total: 2.7 g/dL (ref 1.5–4.5)
Glucose: 81 mg/dL (ref 70–99)
Potassium: 4.5 mmol/L (ref 3.5–5.2)
Sodium: 139 mmol/L (ref 134–144)
Total Protein: 7.4 g/dL (ref 6.0–8.5)
eGFR: 104 mL/min/{1.73_m2} (ref >59)

## 2021-12-10 LAB — CBC
Hematocrit: 39.1 % (ref 34.0–46.6)
Hemoglobin: 13.8 g/dL (ref 11.1–15.9)
MCH: 30.1 pg (ref 26.6–33.0)
MCHC: 35.3 g/dL (ref 31.5–35.7)
MCV: 85 fL (ref 79–97)
Platelets: 339 10*3/uL (ref 150–450)
RBC: 4.58 x10E6/uL (ref 3.77–5.28)
RDW: 12.4 % (ref 11.7–15.4)
WBC: 10.2 10*3/uL (ref 3.4–10.8)

## 2021-12-10 LAB — LIPASE: Lipase: 145 U/L — ABNORMAL HIGH (ref 14–72)

## 2021-12-15 ENCOUNTER — Ambulatory Visit: Payer: Federal, State, Local not specified - PPO | Admitting: Family Medicine

## 2021-12-15 VITALS — BP 106/69 | HR 81 | Temp 97.2°F | Ht 65.0 in | Wt 153.0 lb

## 2021-12-15 DIAGNOSIS — R7989 Other specified abnormal findings of blood chemistry: Secondary | ICD-10-CM | POA: Diagnosis not present

## 2021-12-15 DIAGNOSIS — R1013 Epigastric pain: Secondary | ICD-10-CM | POA: Diagnosis not present

## 2021-12-15 NOTE — Assessment & Plan Note (Signed)
Doing well at this time.  Pain-free.  Repeat labs in approximately 1 month.  Continue Protonix.

## 2021-12-15 NOTE — Patient Instructions (Signed)
Stay on the medication.  Watch watch you eat and limit alcohol.  If occurs again, please let me know.  Labs in ~ 4 weeks.

## 2021-12-15 NOTE — Progress Notes (Signed)
Subjective:  Patient ID: Shannon Cole, female    DOB: March 20, 1991  Age: 31 y.o. MRN: [id number]  CC: Chief Complaint  Patient presents with   Follow-up    Abdominal pain follow up , no current concerns     HPI:  31 year old female presents for follow-up.  Patient recently seen on 8/10.  At that time was experiencing pain in the upper abdomen/lower part of the sternum.  Labs revealed elevated LFTs and mildly elevated lipase at 145.  Right upper quadrant ultrasound revealed tiny gallstones.  Patient was started on Protonix.  Patient presents today for follow-up.  She states that she has not had any recurrences of her pain.  She is watching her diet.  She is doing well at this time.  I have advised her to avoid alcohol and to avoid food triggers.  I do not feel that the tiny gallstones were noted on ultrasound of the culprit of her pain.    Patient Active Problem List   Diagnosis Date Noted   Epigastric pain 12/09/2021   Anxiety 05/14/2021   PTSD (post-traumatic stress disorder) 08/18/2015   Allergic rhinitis 12/17/2014    Social Hx   Social History   Socioeconomic History   Marital status: Single    Spouse name: Not on file   Number of children: 0   Years of education: Not on file   Highest education level: Not on file  Occupational History   Not on file  Tobacco Use   Smoking status: Never   Smokeless tobacco: Never  Vaping Use   Vaping Use: Never used  Substance and Sexual Activity   Alcohol use: Yes    Comment: socially   Drug use: No   Sexual activity: Yes    Birth control/protection: Pill  Other Topics Concern   Not on file  Social History Narrative   Not on file   Social Determinants of Health   Financial Resource Strain: Not on file  Food Insecurity: Not on file  Transportation Needs: Not on file  Physical Activity: Not on file  Stress: Not on file  Social Connections: Not on file    Review of Systems  Constitutional: Negative.    Gastrointestinal:        No current abdominal pain.   Objective:  BP 106/69   Pulse 81   Temp (!) 97.2 F (36.2 C)   Ht 5' 5" (1.651 m)   Wt 153 lb (69.4 kg)   SpO2 98%   BMI 25.46 kg/m      12/15/2021    1:16 PM 12/09/2021   11:28 AM 06/28/2021    1:10 PM  BP/Weight  Systolic BP 272 536 644  Diastolic BP 69 67 70  Wt. (Lbs) 153 155.6 148  BMI 25.46 kg/m2 25.89 kg/m2 24.63 kg/m2    Physical Exam Constitutional:      General: She is not in acute distress.    Appearance: Normal appearance.  HENT:     Head: Normocephalic and atraumatic.  Cardiovascular:     Rate and Rhythm: Normal rate and regular rhythm.  Pulmonary:     Effort: Pulmonary effort is normal.     Breath sounds: Normal breath sounds. No wheezing, rhonchi or rales.  Abdominal:     General: There is no distension.     Palpations: Abdomen is soft.     Tenderness: There is no abdominal tenderness.  Neurological:     Mental Status: She is alert.  Psychiatric:  Mood and Affect: Mood normal.        Behavior: Behavior normal.     Lab Results  Component Value Date   WBC 10.2 12/09/2021   HGB 13.8 12/09/2021   HCT 39.1 12/09/2021   PLT 339 12/09/2021   GLUCOSE 81 12/09/2021   ALT 190 (H) 12/09/2021   AST 386 (H) 12/09/2021   NA 139 12/09/2021   K 4.5 12/09/2021   CL 101 12/09/2021   CREATININE 0.79 12/09/2021   BUN 12 12/09/2021   CO2 21 12/09/2021     Assessment & Plan:   Problem List Items Addressed This Visit       Other   Epigastric pain - Primary    Doing well at this time.  Pain-free.  Repeat labs in approximately 1 month.  Continue Protonix.      Relevant Orders   Lipase   Other Visit Diagnoses     Elevated LFTs       Relevant Orders   CMP14+EGFR      Follow-up:  Return if symptoms worsen or fail to improve.  [place]

## 2021-12-21 ENCOUNTER — Encounter: Payer: Self-pay | Admitting: Family Medicine

## 2021-12-28 ENCOUNTER — Ambulatory Visit: Payer: Federal, State, Local not specified - PPO | Admitting: Family Medicine

## 2022-01-18 ENCOUNTER — Ambulatory Visit: Payer: Federal, State, Local not specified - PPO | Admitting: Family Medicine

## 2022-01-18 VITALS — BP 114/64 | HR 84 | Temp 98.2°F | Ht 65.0 in | Wt 152.0 lb

## 2022-01-18 DIAGNOSIS — K8020 Calculus of gallbladder without cholecystitis without obstruction: Secondary | ICD-10-CM | POA: Diagnosis not present

## 2022-01-18 DIAGNOSIS — R1013 Epigastric pain: Secondary | ICD-10-CM

## 2022-01-18 NOTE — Patient Instructions (Signed)
Continue your current medication.  Labs today.  Follow up in 6 months to 1 year unless you have trouble.  Take care  Dr. Lacinda Axon

## 2022-01-19 DIAGNOSIS — K8020 Calculus of gallbladder without cholecystitis without obstruction: Secondary | ICD-10-CM | POA: Insufficient documentation

## 2022-01-19 LAB — CMP14+EGFR
ALT: 9 IU/L (ref 0–32)
AST: 14 IU/L (ref 0–40)
Albumin/Globulin Ratio: 2 (ref 1.2–2.2)
Albumin: 4.8 g/dL (ref 4.0–5.0)
Alkaline Phosphatase: 78 IU/L (ref 44–121)
BUN/Creatinine Ratio: 13 (ref 9–23)
BUN: 10 mg/dL (ref 6–20)
Bilirubin Total: 0.3 mg/dL (ref 0.0–1.2)
CO2: 20 mmol/L (ref 20–29)
Calcium: 9.4 mg/dL (ref 8.7–10.2)
Chloride: 103 mmol/L (ref 96–106)
Creatinine, Ser: 0.79 mg/dL (ref 0.57–1.00)
Globulin, Total: 2.4 g/dL (ref 1.5–4.5)
Glucose: 88 mg/dL (ref 70–99)
Potassium: 3.8 mmol/L (ref 3.5–5.2)
Sodium: 142 mmol/L (ref 134–144)
Total Protein: 7.2 g/dL (ref 6.0–8.5)
eGFR: 104 mL/min/{1.73_m2} (ref >59)

## 2022-01-19 LAB — LIPASE: Lipase: 25 U/L (ref 14–72)

## 2022-01-19 NOTE — Assessment & Plan Note (Signed)
Doing well at this time.  Continue Protonix.  Laboratory studies today to reassess LFTs and lipase.

## 2022-01-19 NOTE — Progress Notes (Signed)
Subjective:  Patient ID: Shannon Cole, female    DOB: Jul 21, 1990  Age: 31 y.o. MRN: [id number]  CC: Chief Complaint  Patient presents with   Follow-up    Epigastric pain has subsided   Abdominal Pain    Follow up    HPI:  31 year old female presents for follow-up regarding epigastric pain.  Patient was previously evaluated and work-up revealed mildly elevated lipase and elevated LFTs.  Alcohol thought to be contributing.  Right upper quadrant ultrasound was obtained and revealed tiny gallstones.  Clinically I did not feel that the gallstones were causing her pain.  Today she presents for follow-up.  She states that she is doing quite well.  She did have some recurrent pain when she forgot to take her medication.  She states that she has changed her diet and is compliant with the medication and is doing well at this time.  She is currently on Protonix 40 mg daily.  She has been avoiding alcohol as well.  Patient Active Problem List   Diagnosis Date Noted   Gallstones 01/19/2022   Epigastric pain 12/09/2021   Anxiety 05/14/2021   PTSD (post-traumatic stress disorder) 08/18/2015   Allergic rhinitis 12/17/2014    Social Hx   Social History   Socioeconomic History   Marital status: Single    Spouse name: Not on file   Number of children: 0   Years of education: Not on file   Highest education level: Not on file  Occupational History   Not on file  Tobacco Use   Smoking status: Never   Smokeless tobacco: Never  Vaping Use   Vaping Use: Never used  Substance and Sexual Activity   Alcohol use: Yes    Comment: socially   Drug use: No   Sexual activity: Yes    Birth control/protection: Pill  Other Topics Concern   Not on file  Social History Narrative   Not on file   Social Determinants of Health   Financial Resource Strain: Not on file  Food Insecurity: Not on file  Transportation Needs: Not on file  Physical Activity: Not on file  Stress: Not on file   Social Connections: Not on file    Review of Systems Per HPI  Objective:  BP 114/64   Pulse 84   Temp 98.2 F (36.8 C)   Ht _0  (1.651 m)   Wt 152 lb (68.9 kg)   SpO2 100%   BMI 25.29 kg/m      01/18/2022    2:11 PM 12/15/2021    1:16 PM 12/09/2021   11:28 AM  BP/Weight  Systolic BP 366 294 765  Diastolic BP 64 69 67  Wt. (Lbs) 152 153 155.6  BMI 25.29 kg/m2 25.46 kg/m2 25.89 kg/m2    Physical Exam Vitals and nursing note reviewed.  Constitutional:      General: She is not in acute distress.    Appearance: Normal appearance.  HENT:     Head: Normocephalic and atraumatic.  Cardiovascular:     Rate and Rhythm: Normal rate and regular rhythm.  Pulmonary:     Effort: Pulmonary effort is normal.     Breath sounds: Normal breath sounds. No wheezing, rhonchi or rales.  Abdominal:     General: There is no distension.     Palpations: Abdomen is soft.     Tenderness: There is no abdominal tenderness.  Neurological:     Mental Status: She is alert.  Psychiatric:  Mood and Affect: Mood normal.        Behavior: Behavior normal.     Lab Results  Component Value Date   WBC 10.2 12/09/2021   HGB 13.8 12/09/2021   HCT 39.1 12/09/2021   PLT 339 12/09/2021   GLUCOSE 88 01/18/2022   ALT 9 01/18/2022   AST 14 01/18/2022   NA 142 01/18/2022   K 3.8 01/18/2022   CL 103 01/18/2022   CREATININE 0.79 01/18/2022   BUN 10 01/18/2022   CO2 20 01/18/2022     Assessment & Plan:   Problem List Items Addressed This Visit       Digestive   Gallstones    Patient is doing well at this time.  Holding off on general surgery referral.        Other   Epigastric pain - Primary    Doing well at this time.  Continue Protonix.  Laboratory studies today to reassess LFTs and lipase.      Relevant Orders   CMP14+EGFR (Completed)   Lipase (Completed)    Follow-up:  Pending [place]

## 2022-01-19 NOTE — Assessment & Plan Note (Signed)
Patient is doing well at this time.  Holding off on general surgery referral.

## 2022-01-25 ENCOUNTER — Telehealth: Payer: Federal, State, Local not specified - PPO | Admitting: Family Medicine

## 2022-01-25 DIAGNOSIS — J069 Acute upper respiratory infection, unspecified: Secondary | ICD-10-CM

## 2022-01-25 NOTE — Progress Notes (Signed)

## 2022-03-06 ENCOUNTER — Other Ambulatory Visit: Payer: Self-pay | Admitting: Family Medicine

## 2022-03-08 ENCOUNTER — Encounter: Payer: Self-pay | Admitting: Emergency Medicine

## 2022-03-08 ENCOUNTER — Ambulatory Visit
Admission: EM | Admit: 2022-03-08 | Discharge: 2022-03-08 | Disposition: A | Discharge status: Home / Self Care | Payer: Federal, State, Local not specified - PPO | Attending: Nurse Practitioner | Admitting: Nurse Practitioner

## 2022-03-08 DIAGNOSIS — R0989 Other specified symptoms and signs involving the circulatory and respiratory systems: Secondary | ICD-10-CM | POA: Diagnosis not present

## 2022-03-08 DIAGNOSIS — Z8709 Personal history of other diseases of the respiratory system: Secondary | ICD-10-CM | POA: Insufficient documentation

## 2022-03-08 DIAGNOSIS — Z1152 Encounter for screening for COVID-19: Secondary | ICD-10-CM | POA: Diagnosis not present

## 2022-03-08 HISTORY — DX: Calculus of gallbladder without cholecystitis without obstruction: K80.20

## 2022-03-08 MED ORDER — CETIRIZINE HCL 10 MG PO TABS: 10.0000 mg | ORAL_TABLET | Freq: Every day | ORAL | 0 refills | Status: DC

## 2022-03-08 MED ORDER — PSEUDOEPH-BROMPHEN-DM 30-2-10 MG/5ML PO SYRP: 5.0000 mL | ORAL_SOLUTION | Freq: Four times a day (QID) | ORAL | 0 refills | Status: DC | PRN

## 2022-03-08 MED ORDER — FLUTICASONE PROPIONATE 50 MCG/ACT NA SUSP: 2.0000 | Freq: Every day | NASAL | 0 refills | Status: DC

## 2022-03-08 NOTE — Discharge Instructions (Addendum)
COVID test is pending.  As discussed, you are a candidate to receive Paxlovid if the COVID test is positive.  You will be contacted if the test is positive. Take medication as prescribed. Increase fluids and allow for plenty of rest. Recommend Tylenol or ibuprofen as needed for pain, fever, or general discomfort. Warm salt water gargles 3-4 times daily to help with throat pain or discomfort. Recommend using a humidifier at bedtime during sleep to help with cough and nasal congestion. Sleep elevated on 2 pillows while cough symptoms persist. Follow up in 7 to 10 days if symptoms do not improve. Follow-up sooner if symptoms worsen.

## 2022-03-08 NOTE — ED Triage Notes (Signed)
Sore throat since Sunday with post nasal drip.  Cough started yesterday with sinus pressure and nasal congestion.  States throat pain is gone and just feels irritated.

## 2022-03-08 NOTE — ED Provider Notes (Signed)
[facility]    CSN: [id number] Arrival date & time: 03/08/22  1039      History   Chief Complaint No chief complaint on file.   HPI Shannon Cole is a 31 y.o. female.   The history is provided by the patient.   Patient presents with a 3-day history of sore throat, postnasal drainage, sinus pressure, and cough.  She states since her symptoms started, throat pain has improved, but continues to feel irritation in her throat.  She states that when she coughs she feels like something is "squeezing" in her throat.  She denies chills, ear pain, headache, wheezing, shortness of breath, difficulty breathing, or GI symptoms.  States she has been taking ibuprofen and Alka-Seltzer for her symptoms.  States several people at work have experienced the same or similar symptoms.  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     Patient Active Problem List   Diagnosis Date Noted   Gallstones 01/19/2022   Epigastric pain 12/09/2021   Anxiety 05/14/2021   PTSD (post-traumatic stress disorder) 08/18/2015   Allergic rhinitis 12/17/2014    Past Surgical History:  Procedure Laterality Date   TONSILLECTOMY AND ADENOIDECTOMY Bilateral 03/03/2015   Procedure: TONSILLECTOMY AND ADENOIDECTOMY;  Surgeon: Leta Baptist, MD;  Location: [place];  Service: ENT;  Laterality: Bilateral;    OB History     Gravida  0   Para  0   Term  0   Preterm  0   AB  0   Living  0      SAB  0   IAB  0   Ectopic  0   Multiple  0   Live Births               Home Medications    Prior to Admission medications   Medication Sig Start Date End Date Taking? Authorizing Provider  brompheniramine-pseudoephedrine-DM 30-2-10 MG/5ML syrup Take 5 mLs by mouth 4 (four) times daily as needed. 03/08/22  Yes Viha Kriegel-Warren, Alda Lea, NP  cetirizine (ZYRTEC) 10 MG tablet Take 1  tablet (10 mg total) by mouth daily. 03/08/22  Yes Laneya Gasaway-Warren, Alda Lea, NP  fluticasone (FLONASE) 50 MCG/ACT nasal spray Place 2 sprays into both nostrils daily. 03/08/22  Yes Laronda Lisby-Warren, Alda Lea, NP  clonazePAM (KLONOPIN) 0.5 MG tablet Take 1 tablet (0.5 mg total) by mouth 2 (two) times daily as needed for anxiety. 12/09/21   Coral Spikes, DO  pantoprazole (PROTONIX) 40 MG tablet TAKE 1 TABLET BY MOUTH EVERY DAY 03/07/22   Coral Spikes, DO    Family History Family History  Problem Relation Age of Onset   Diabetes Mother    Cancer Father        lung    Social History Social History   Tobacco Use   Smoking status: Never   Smokeless tobacco: Never  Vaping Use   Vaping Use: Never used  Substance Use Topics   Alcohol use: Yes    Comment: socially   Drug use: No     Allergies   Penicillins   Review of Systems Review of Systems Per HPI  Physical Exam Triage Vital Signs ED Triage Vitals  Enc Vitals Group     BP 03/08/22 1127 114/79     Pulse Rate 03/08/22 1127 99     Resp 03/08/22 1127 18  Temp 03/08/22 1127 98.9 F (37.2 C)     Temp Source 03/08/22 1127 Oral     SpO2 03/08/22 1127 97 %     Weight --      Height --      Head Circumference --      Peak Flow --      Pain Score 03/08/22 1128 4     Pain Loc --      Pain Edu? --      Excl. in [place]? --    No data found.  Updated Vital Signs BP 114/79 (BP Location: Right Arm)   Pulse 99   Temp 98.9 F (37.2 C) (Oral)   Resp 18   LMP 02/22/2022 (Approximate)   SpO2 97%   Visual Acuity Right Eye Distance:   Left Eye Distance:   Bilateral Distance:    Right Eye Near:   Left Eye Near:    Bilateral Near:     Physical Exam Vitals and nursing note reviewed.  Constitutional:      General: She is not in acute distress.    Appearance: Normal appearance.  HENT:     Head: Normocephalic.     Right Ear: Tympanic membrane, ear canal and external ear normal.     Left Ear: Tympanic membrane, ear canal  and external ear normal.     Nose: Nose normal.     Right Turbinates: Enlarged and swollen.     Left Turbinates: Enlarged and swollen.     Right Sinus: No maxillary sinus tenderness or frontal sinus tenderness.     Left Sinus: No maxillary sinus tenderness or frontal sinus tenderness.     Mouth/Throat:     Lips: Pink.     Mouth: Mucous membranes are moist.     Pharynx: Posterior oropharyngeal erythema present.     Comments: Cobblestoning present on posterior tongue. Eyes:     Extraocular Movements: Extraocular movements intact.     Conjunctiva/sclera: Conjunctivae normal.     Pupils: Pupils are equal, round, and reactive to light.  Cardiovascular:     Rate and Rhythm: Normal rate and regular rhythm.     Pulses: Normal pulses.     Heart sounds: Normal heart sounds.  Pulmonary:     Effort: Pulmonary effort is normal.     Breath sounds: Normal breath sounds.  Abdominal:     General: Bowel sounds are normal.     Palpations: Abdomen is soft.  Musculoskeletal:     Cervical back: Normal range of motion.  Lymphadenopathy:     Cervical: No cervical adenopathy.  Skin:    General: Skin is warm and dry.  Neurological:     General: No focal deficit present.     Mental Status: She is alert and oriented to person, place, and time.  Psychiatric:        Mood and Affect: Mood normal.        Behavior: Behavior normal.      UC Treatments / Results  Labs (all labs ordered are listed, but only abnormal results are displayed) Labs Reviewed  SARS CORONAVIRUS 2 (TAT 6-24 HRS)    EKG   Radiology No results found.  Procedures Procedures (including critical care time)  Medications Ordered in UC Medications - No data to display  Initial Impression / Assessment and Plan / UC Course  I have reviewed the triage vital signs and the nursing notes.  Pertinent labs & imaging results that were available during my care of the patient  were reviewed by me and considered in my medical decision  making (see chart for details).  Patient presents with a 3-day history of upper respiratory symptoms.  Patient's vital signs are stable, she is well-appearing, and is in no acute distress.  Symptoms consistent with a viral upper respiratory infection.  COVID test is pending.  Patient is a candidate to receive Paxlovid if the test is positive.  In the interim, will treat patient with Bromfed cough syrup, cetirizine 10 mg, and fluticasone 50 mcg nasal spray.  Supportive care recommendations were provided to the patient.  Work note was provided.  Patient verbalizes understanding.  All answered.  Patient is stable for discharge. Final Clinical Impressions(s) / UC Diagnoses   Final diagnoses:  Symptoms of upper respiratory infection (URI)  History of allergic rhinitis  Encounter for screening for COVID-19     Discharge Instructions      COVID test is pending.  As discussed, you are a candidate to receive Paxlovid if the COVID test is positive.  You will be contacted if the test is positive. Take medication as prescribed. Increase fluids and allow for plenty of rest. Recommend Tylenol or ibuprofen as needed for pain, fever, or general discomfort. Warm salt water gargles 3-4 times daily to help with throat pain or discomfort. Recommend using a humidifier at bedtime during sleep to help with cough and nasal congestion. Sleep elevated on 2 pillows while cough symptoms persist. Follow up in 7 to 10 days if symptoms do not improve. Follow-up sooner if symptoms worsen.      ED Prescriptions     Medication Sig Dispense Auth. Provider   brompheniramine-pseudoephedrine-DM 30-2-10 MG/5ML syrup Take 5 mLs by mouth 4 (four) times daily as needed. 140 mL Tenika Keeran-Warren, Alda Lea, NP   cetirizine (ZYRTEC) 10 MG tablet Take 1 tablet (10 mg total) by mouth daily. 30 tablet Casper Pagliuca-Warren, Alda Lea, NP   fluticasone (FLONASE) 50 MCG/ACT nasal spray Place 2 sprays into both nostrils daily. 16 g Alle Difabio-Warren,  Alda Lea, NP      PDMP not reviewed this encounter.   Tish Men, NP 03/08/22 1143

## 2022-03-09 LAB — SARS CORONAVIRUS 2 (TAT 6-24 HRS): SARS Coronavirus 2: NEGATIVE

## 2022-03-22 ENCOUNTER — Telehealth: Payer: Self-pay | Admitting: *Deleted

## 2022-03-22 NOTE — Telephone Encounter (Signed)
Patient's insurance will only pay for 90 days worth of any PPI every 365 days and she is currently taking Protonix and only has 3 pills left but can't fill again till 12/2022. The cash price was over $200 but patient states she needs to take something for her stomach - Please advise

## 2022-03-23 NOTE — Telephone Encounter (Addendum)
Patient notified and verbalized understanding. 

## 2022-03-23 NOTE — Telephone Encounter (Signed)
Cook, Jayce G, DO     Omeprazole 20-40 mg daily (OTC).

## 2022-06-17 ENCOUNTER — Other Ambulatory Visit (HOSPITAL_COMMUNITY): Payer: Self-pay

## 2022-06-17 MED FILL — Pantoprazole Sodium EC Tab 40 MG (Base Equiv): ORAL | 90 days supply | Qty: 90 | Fill #0 | Status: AC

## 2022-09-16 DIAGNOSIS — R070 Pain in throat: Secondary | ICD-10-CM | POA: Diagnosis not present

## 2022-09-16 DIAGNOSIS — J0100 Acute maxillary sinusitis, unspecified: Secondary | ICD-10-CM | POA: Diagnosis not present

## 2022-09-23 ENCOUNTER — Other Ambulatory Visit (HOSPITAL_COMMUNITY): Payer: Self-pay

## 2022-09-23 MED FILL — Pantoprazole Sodium EC Tab 40 MG (Base Equiv): ORAL | 90 days supply | Qty: 90 | Fill #1 | Status: AC

## 2023-02-15 ENCOUNTER — Other Ambulatory Visit: Payer: Self-pay | Admitting: Family Medicine

## 2023-02-15 ENCOUNTER — Other Ambulatory Visit: Payer: Self-pay

## 2023-02-15 ENCOUNTER — Other Ambulatory Visit (HOSPITAL_COMMUNITY): Payer: Self-pay

## 2023-02-15 MED ORDER — PANTOPRAZOLE SODIUM 40 MG PO TBEC
40.0000 mg | DELAYED_RELEASE_TABLET | Freq: Every day | ORAL | 1 refills | Status: DC
  Filled 2023-02-15: qty 90, 90d supply, fill #0
  Filled 2023-06-06: qty 90, 90d supply, fill #1

## 2023-02-16 ENCOUNTER — Encounter: Payer: Self-pay | Admitting: Pharmacist

## 2023-02-16 ENCOUNTER — Other Ambulatory Visit (HOSPITAL_COMMUNITY): Payer: Self-pay

## 2023-02-16 ENCOUNTER — Other Ambulatory Visit: Payer: Self-pay

## 2023-02-20 ENCOUNTER — Other Ambulatory Visit (HOSPITAL_COMMUNITY): Payer: Self-pay

## 2023-02-21 ENCOUNTER — Other Ambulatory Visit (HOSPITAL_COMMUNITY): Payer: Self-pay

## 2023-02-21 ENCOUNTER — Encounter (HOSPITAL_COMMUNITY): Payer: Self-pay

## 2023-02-21 ENCOUNTER — Other Ambulatory Visit: Payer: Self-pay

## 2023-05-30 ENCOUNTER — Ambulatory Visit (INDEPENDENT_AMBULATORY_CARE_PROVIDER_SITE_OTHER): Payer: BC Managed Care – PPO | Admitting: Adult Health

## 2023-05-30 ENCOUNTER — Encounter: Payer: Self-pay | Admitting: Adult Health

## 2023-05-30 ENCOUNTER — Other Ambulatory Visit (HOSPITAL_COMMUNITY)
Admission: RE | Admit: 2023-05-30 | Discharge: 2023-05-30 | Disposition: A | Discharge status: Home / Self Care | Payer: BC Managed Care – PPO | Source: Ambulatory Visit | Attending: Adult Health | Admitting: Adult Health

## 2023-05-30 VITALS — BP 110/73 | HR 75 | Ht 64.0 in | Wt 160.0 lb

## 2023-05-30 DIAGNOSIS — Z1151 Encounter for screening for human papillomavirus (HPV): Secondary | ICD-10-CM

## 2023-05-30 DIAGNOSIS — Z01419 Encounter for gynecological examination (general) (routine) without abnormal findings: Secondary | ICD-10-CM

## 2023-05-30 DIAGNOSIS — N920 Excessive and frequent menstruation with regular cycle: Secondary | ICD-10-CM

## 2023-05-30 DIAGNOSIS — N946 Dysmenorrhea, unspecified: Secondary | ICD-10-CM | POA: Diagnosis not present

## 2023-05-30 NOTE — Progress Notes (Signed)
Patient ID: Shannon Cole, female   DOB: 1990/10/04, 33 y.o.   MRN: [id number] History of Present Illness: Shannon Cole is a 33 year old white female, with SO, G0P0, in for a well woman gyn exam and pap. She says she has bad period cramps, periods come every 3 weeks and the first 2 days are heavy. She changes pads every 3-4 hours. She has missed work due to cramps. She is wanting to get a tubal ligation does not want children.  PCP is Dr Adriana Simas.    Current Medications, Allergies, Past Medical History, Past Surgical History, Family History and Social History were reviewed in [employer] record.     Review of Systems:  Patient denies any headaches, hearing loss, fatigue, blurred vision, shortness of breath, chest pain, abdominal pain, problems with bowel movements, urination, or intercourse. No joint pain or mood swings.  See HPI for positives.   Physical Exam:BP 110/73 (BP Location: Left Arm, Patient Position: Sitting, Cuff Size: Normal)   Pulse 75   Ht 5\' 4"  (1.626 m)   Wt 160 lb (72.6 kg)   LMP 05/20/2023   BMI 27.46 kg/m   General:  Well developed, well nourished, no acute distress Skin:  Warm and dry, has multiple tattoos Neck:  Midline trachea, normal thyroid, good ROM, no lymphadenopathy Lungs; Clear to auscultation bilaterally Breast:  No dominant palpable mass, retraction, or nipple discharge Cardiovascular: Regular rate and rhythm Abdomen:  Soft, non tender, no hepatosplenomegaly Pelvic:  External genitalia is normal in appearance, no lesions.  The vagina is normal in appearance. Urethra has no lesions or masses. The cervix is smooth, pap with HR HPV genotyping performed .  Uterus is felt to be normal size, shape, and contour.  No adnexal masses or tenderness noted.Bladder is non tender, no masses felt. Extremities/musculoskeletal:  No swelling or varicosities noted, no clubbing or cyanosis Psych:  No mood changes, alert and cooperative,seems happy AA is  2 Fall risk is low    05/30/2023   11:26 AM 01/18/2022    2:16 PM 10/23/2018    8:47 AM  Depression screen PHQ 2/9  Decreased Interest 0 0 0  Down, Depressed, Hopeless 1 0 0  PHQ - 2 Score 1 0 0  Altered sleeping 1  1  Tired, decreased energy 1  1  Change in appetite 0  0  Feeling bad or failure about yourself  0  0  Trouble concentrating 0  0  Moving slowly or fidgety/restless 0  0  Suicidal thoughts 0  0  PHQ-9 Score 3  2  Difficult doing work/chores   Not difficult at all       05/30/2023   11:26 AM  GAD 7 : Generalized Anxiety Score  Nervous, Anxious, on Edge 1  Control/stop worrying 1  Worry too much - different things 1  Trouble relaxing 0  Restless 1  Easily annoyed or irritable 1  Afraid - awful might happen 1  Total GAD 7 Score 6      Upstream - 05/30/23 1129       Pregnancy Intention Screening   Does the patient want to become pregnant in the next year? No    Does the patient's partner want to become pregnant in the next year? No    Would the patient like to discuss contraceptive options today? No      Contraception Wrap Up   Current Method Female Condom    End Method Female Condom  Examination chaperoned by Malachy Mood LPN   Impression and plan: 1. Encounter for gynecological examination with Papanicolaou smear of cervix (Primary) Pap sent Pap in 3 years if normal Physical with PCP - Cytology - PAP( [place]) - CBC - Comprehensive metabolic panel  2. Menorrhagia with regular cycle Periods heavy first 2 days Periods every 3 weeks Will check labs  - CBC - TSH + free T4 - Progesterone Gave handout on endometrial ablation She declines IUD or birth control   3. Dysmenorrhea Has painful bad cramps, has missed work Discussed endometrial ablation if gets tubal  She wants progesterone level checked  - Progesterone   Return 06/08/23 to discuss tubal and endometrial ablation with Dr Charlotta Newton

## 2023-05-31 LAB — CBC
Hematocrit: 38 % (ref 34.0–46.6)
Hemoglobin: 12.7 g/dL (ref 11.1–15.9)
MCH: 29.5 pg (ref 26.6–33.0)
MCHC: 33.4 g/dL (ref 31.5–35.7)
MCV: 88 fL (ref 79–97)
Platelets: 376 10*3/uL (ref 150–450)
RBC: 4.31 x10E6/uL (ref 3.77–5.28)
RDW: 12.4 % (ref 11.7–15.4)
WBC: 9.7 10*3/uL (ref 3.4–10.8)

## 2023-05-31 LAB — COMPREHENSIVE METABOLIC PANEL
ALT: 20 [IU]/L (ref 0–32)
AST: 21 [IU]/L (ref 0–40)
Albumin: 4.5 g/dL (ref 4.0–5.0)
Alkaline Phosphatase: 95 [IU]/L (ref 44–121)
BUN/Creatinine Ratio: 11 (ref 9–23)
BUN: 8 mg/dL (ref 6–20)
Bilirubin Total: 0.3 mg/dL (ref 0.0–1.2)
CO2: 24 mmol/L (ref 20–29)
Calcium: 9.2 mg/dL (ref 8.7–10.2)
Chloride: 102 mmol/L (ref 96–106)
Creatinine, Ser: 0.71 mg/dL (ref 0.57–1.00)
Globulin, Total: 2.5 g/dL (ref 1.5–4.5)
Glucose: 65 mg/dL — ABNORMAL LOW (ref 70–99)
Potassium: 4.7 mmol/L (ref 3.5–5.2)
Sodium: 141 mmol/L (ref 134–144)
Total Protein: 7 g/dL (ref 6.0–8.5)
eGFR: 117 mL/min/{1.73_m2} (ref >59)

## 2023-05-31 LAB — PROGESTERONE: Progesterone: 0.1 ng/mL

## 2023-05-31 LAB — TSH+FREE T4
Free T4: 1.25 ng/dL (ref 0.82–1.77)
TSH: 0.833 u[IU]/mL (ref 0.450–4.500)

## 2023-06-03 LAB — CYTOLOGY - PAP
Adequacy: ABSENT
Comment: NEGATIVE
Diagnosis: NEGATIVE
High risk HPV: NEGATIVE

## 2023-06-08 ENCOUNTER — Other Ambulatory Visit: Payer: Self-pay

## 2023-06-08 ENCOUNTER — Ambulatory Visit: Payer: BC Managed Care – PPO | Admitting: Obstetrics & Gynecology

## 2023-06-08 ENCOUNTER — Encounter: Payer: Self-pay | Admitting: Obstetrics & Gynecology

## 2023-06-08 VITALS — BP 119/80 | HR 75 | Ht 64.0 in | Wt 157.4 lb

## 2023-06-08 DIAGNOSIS — Z3009 Encounter for other general counseling and advice on contraception: Secondary | ICD-10-CM

## 2023-06-08 DIAGNOSIS — N946 Dysmenorrhea, unspecified: Secondary | ICD-10-CM | POA: Diagnosis not present

## 2023-06-08 NOTE — Progress Notes (Signed)
   GYN VISIT Patient name: Shannon Cole MRN [id number]  Date of birth: 1991/02/21 Chief Complaint:   Pre-op Exam (Discuss tubal and ablation)  History of Present Illness:   Shannon Cole is a 33 y.o. G0P0000 female being seen today for the following concerns:  -Contraceptive management:   Menses are regular each month- last for 5 day.  2 heavy days using about 3-4 pads per day. Some months are worse than others, some months she may need to miss work.  On occasion when she is on her last day, she will have horrible pelvic pain.  Minimal improvement with OTC medication and will last for a few hours then resolve.  Tried Depot x 9mos then COCs since 33yo-33yo and notes side effects including low libido, change in her weight, depression/anxiety.  Feels much better off hormonal contraception.  Never desires to have a pregnancy, does not think children are in the future for her.  Notes that if indeed she wanted a child, she would adopt.  Patient's last menstrual period was 05/20/2023.    Review of Systems:   Pertinent items are noted in HPI Denies fever/chills, dizziness, headaches, visual disturbances, fatigue, shortness of breath, chest pain, abdominal pain, vomiting, no problems with bowel movements, urination, or intercourse unless otherwise stated above.  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 Physical Assessment:   Vitals:   06/08/23 0927  BP: 119/80  Pulse: 75  Weight: 157 lb 6.4 oz (71.4 kg)  Height: 5' 4 (1.626 m)  Body mass  index is 27.02 kg/m.       Physical Examination:   General appearance: alert, well appearing, and in no distress  Psych: mood appropriate, normal affect  Skin: warm & dry   Cardiovascular: normal heart rate noted  Respiratory: normal respiratory effort, no distress  Abdomen: soft, non-tender   Pelvic: examination not indicated  Extremities: no edema   Chaperone: N/A    Assessment & Plan:  1) Contraceptive management, desires sterilization -Discussed laparoscopic bilateral salpingectomy  Patient desires permanent sterilization.  Other reversible forms of contraception were discussed with patient; she declines all other modalities. Risks of procedure discussed with patient including but not limited to: risk of regret, permanence of method, bleeding, infection, and potential injury to surrounding organs.  Discussed that especially in setting of salpingectomy, risk of ectopic pregnancy low, less than 1%.  Also discussed possibility of post-tubal pain syndrome. Patient verbalized understanding of these risks and wants to proceed with sterilization.   -Briefly discussed endometrial ablation; however, based on her current symptoms do not think an endometrial ablation is warranted -Following salpingectomy May reconsider hormonal management of her menses if needed or ablation in the future  -surgical referral created- March 26  Orders Placed This Encounter  Procedures   Ambulatory Referral For Surgery Scheduling    Return for TBD.   Gracen Southwell, DO Attending Obstetrician & Gynecologist, [facility] for [employer], [facility]

## 2023-06-13 ENCOUNTER — Encounter: Payer: Self-pay | Admitting: Obstetrics & Gynecology

## 2023-07-24 ENCOUNTER — Other Ambulatory Visit (HOSPITAL_COMMUNITY): Payer: BC Managed Care – PPO

## 2023-07-26 NOTE — Patient Instructions (Signed)
 Shannon Cole  07/26/2023        Your procedure is scheduled on Tuesday 08/01/23.   Report to [facility] Main Entrance at 6:00 A.M.   Call this number if you have problems the morning of surgery:  [phone]  If you experience any cold or flu symptoms such as cough, fever, chills, shortness of breath, etc. between now and your scheduled surgery, please notify us at the above number.   Remember:   Do not eat after midnight.   You may drink clear liquids until 3:25 am .  Clear liquids allowed are:  Water, Juice (No red color; non-citric and without pulp; diabetics please choose diet or no sugar options), Carbonated beverages (diabetics please choose diet or no sugar options), Clear Tea (No creamer, milk, or cream, including half & half and powdered creamer), Black Coffee Only (No creamer, milk or cream, including half & half and powdered creamer), and Clear Sports drink (No red color; diabetics please choose diet or no sugar options)    Take these medicines the morning of surgery with A SIP OF WATER    pantoprazole (PROTONIX)   clonazePAM (KLONOPIN) if needed    Do not wear jewelry, make-up or nail polish, including gel polish,  artificial nails, or any other type of covering on natural nails (fingers and  toes).  Do not wear lotions, powders, or perfumes, or deodorant.  Do not shave 48 hours prior to surgery.   Do not bring valuables to the hospital.  [facility] is not responsible for any belongings or valuables.  Contacts, dentures or bridgework may not be worn into surgery.  Leave your suitcase in the car.  After surgery it may be brought to your room.  For patients admitted to the hospital, discharge time will be determined by your treatment team.  Patients discharged the day of surgery will not be allowed to drive home and must have someone with them for 24 hours.   Special instructions:  DO NOT SMOKE TOBACCO OR VAPE FOR 24 HOURS PRIOR TO YOUR SURGERY    Please  read over the following fact sheets that you were given. Anesthesia Post-op Instructions and Care and Recovery After Surgery    Pre-operative CHG Instructions  You can play a key role in reducing the risk of infection after surgery. Your skin is not sterile and needs to be as free of germs as possible. You can reduce the number of germs on your skin by washing with CHG (chlorhexidine gluconate) soap before surgery. CHG is an antiseptic soap that kills germs and bonds with the skin to continue killing germs even after washing.  Please DO NOT use if you have an allergy to chlorhexidine/ CHG or antibacterial soaps. If your skin becomes reddened/irritated stop using the CHG and inform your nurse when you arrive at Short Stay.  Do not shave (including legs and underarms) for at least 48 hours prior to the first CHG shower. You may shave your face.  Please follow these instructions carefully:  1.  Shower with CHG Soap the night before surgery and the morning of surgery. 2.  If you choose to wash your hair, wash your hair first as usual with your normal shampoo/ conditioner. Wash your genitals (private parts) with your normal soap. 4.  After you shampoo/ condition hair and wash your private parts, rinse your hair and body thoroughly to remove the shampoo/ conditioner/ soap. 5.  Use CHG as you would any other liquid soap.  You can apply CHG directly to the skin and wash gently using a clean washcloth. 6.  Apply the CHG Soap to your body ONLY FROM THE NECK DOWN.         Do not use on open wounds or open sores.  Avoid contact with your eyes, ears, mouth, and genitals (private parts).  Wash thoroughly, paying special attention to the area where your surgery will be performed. 7.  Thoroughly rinse your body with warm water from the neck down. 8.  DO NOT shower/ wash with your normal soap after using and rinsing off the CHG Soap. 9.  Pat yourself dry with a clean towel. DO NOT use lotions, powders,  deodorants, or other non-prescription skin care products after taking the CHG bath. 10.  Wear clean pajamas. 11.  Place clean sheets on your bed the night of your first shower and do not sleep with pets.  Day of Surgery  Use CHG Soap as instructed above. Do not apply any lotions, deodorants, cologne, or perfumes on the morning of surgery. Ask your nurse before applying any prescription medications to the skin. Please wear clean clothes to the hospital/surgery center. BRUSH YOUR TEETH.   Surgery to Take Out One or Both Fallopian Tubes (Salpingectomy): What to Know After After one or both of your fallopian tubes are taken out, you may have pain in your belly and light bleeding from your vagina for a few days. You may also feel tired for a few days. Your recovery time will depend on the method that was used in your surgery. Follow these instructions at home: Medicines Take your medicines only as told. You may need to take steps to help treat or prevent trouble pooping (constipation), such as: Taking medicines to help you poop. Eating foods high in fiber, like beans, whole grains, and fresh fruits and vegetables. Drinking more fluids as told. Ask your health care provider if it's safe to drive or use machines while taking your medicine. Caring for your cuts from surgery  Take care of the cuts in your belly as told. Make sure you: Wash your hands with soap and water for at least 20 seconds before and after you change your bandage. If you can't use soap and water, use hand sanitizer. Change your bandage. Leave stitches, staples, or skin glue alone. Leave tape strips alone unless you're told to take them off. You may trim the edges of the tape strips if they curl up. Check the cuts on your belly every day for signs of infection. Check for: More redness, swelling, or pain. More fluid or blood. Warmth. Pus or a bad smell. Activity Rest as told. Get up to take short walks at least every 2  hours during the day. This helps you breathe better and keeps your blood flowing. Ask for help if you feel weak or unsteady. Do not lift anything heavier than 10 lb (4.5 kg) until you're told it's OK. Do not take baths, swim, or use a hot tub until you're told it's OK. Ask if you can shower. Ask what things are safe for you to do at home. Ask when you can go back to work or school. General instructions Do not smoke, vape, or use nicotine or tobacco. Wear compression stockings to reduce swelling and help prevent blood clots in your legs. Your provider may give you more instructions. Make sure you know what you can and can't do. Contact a health care provider if: You have pain when you pee.  You have symptoms of infection in the cuts in your belly. You have a fever. You have pain in your belly that gets worse or does not get better with medicine. You have a rash. You feel light-headed. You throw up or feel like throwing up. Get help right away if: You have pain in your chest or leg. You have shortness of breath. You faint. You have more bleeding from your vagina that soaks one pad in an hour. These symptoms may be an emergency. Call 911 right away. Do not wait to see if the symptoms will go away. Do not drive yourself to the hospital. This information is not intended to replace advice given to you by your health care provider. Make sure you discuss any questions you have with your health care provider. Document Revised: 11/28/2022 Document Reviewed: 11/28/2022 Elsevier Patient Education  2024 [employer] Inc.   General Anesthesia, Adult, Care After The following information offers guidance on how to care for yourself after your procedure. Your health care provider may also give you more specific instructions. If you have problems or questions, contact your health care provider. What can I expect after the procedure? After the procedure, it is common for people to: Have pain or discomfort  at the IV site. Have nausea or vomiting. Have a sore throat or hoarseness. Have trouble concentrating. Feel cold or chills. Feel weak, sleepy, or tired (fatigue). Have soreness and body aches. These can affect parts of the body that were not involved in surgery. Follow these instructions at home: For the time period you were told by your health care provider:  Rest. Do not participate in activities where you could fall or become injured. Do not drive or use machinery. Do not drink alcohol. Do not take sleeping pills or medicines that cause drowsiness. Do not make important decisions or sign legal documents. Do not take care of children on your own. General instructions Drink enough fluid to keep your urine pale yellow. If you have sleep apnea, surgery and certain medicines can increase your risk for breathing problems. Follow instructions from your health care provider about wearing your sleep device: Anytime you are sleeping, including during daytime naps. While taking prescription pain medicines, sleeping medicines, or medicines that make you drowsy. Return to your normal activities as told by your health care provider. Ask your health care provider what activities are safe for you. Take over-the-counter and prescription medicines only as told by your health care provider. Do not use any products that contain nicotine or tobacco. These products include cigarettes, chewing tobacco, and vaping devices, such as e-cigarettes. These can delay incision healing after surgery. If you need help quitting, ask your health care provider. Contact a health care provider if: You have nausea or vomiting that does not get better with medicine. You vomit every time you eat or drink. You have pain that does not get better with medicine. You cannot urinate or have bloody urine. You develop a skin rash. You have a fever. Get help right away if: You have trouble breathing. You have chest pain. You  vomit blood. These symptoms may be an emergency. Get help right away. Call 911. Do not wait to see if the symptoms will go away. Do not drive yourself to the hospital. Summary After the procedure, it is common to have a sore throat, hoarseness, nausea, vomiting, or to feel weak, sleepy, or fatigue. For the time period you were told by your health care provider, do not drive or  use machinery. Get help right away if you have difficulty breathing, have chest pain, or vomit blood. These symptoms may be an emergency. This information is not intended to replace advice given to you by your health care provider. Make sure you discuss any questions you have with your health care provider. Document Revised: 07/16/2021 Document Reviewed: 07/16/2021 Elsevier Patient Education  2024 [employer].

## 2023-07-28 ENCOUNTER — Encounter (HOSPITAL_COMMUNITY): Payer: Self-pay

## 2023-07-28 ENCOUNTER — Encounter (HOSPITAL_COMMUNITY)
Admission: RE | Admit: 2023-07-28 | Discharge: 2023-07-28 | Disposition: A | Discharge status: Home / Self Care | Payer: BC Managed Care – PPO | Source: Ambulatory Visit | Attending: Obstetrics & Gynecology | Admitting: Obstetrics & Gynecology

## 2023-07-28 DIAGNOSIS — Z01812 Encounter for preprocedural laboratory examination: Secondary | ICD-10-CM | POA: Diagnosis not present

## 2023-07-28 DIAGNOSIS — Z01818 Encounter for other preprocedural examination: Secondary | ICD-10-CM

## 2023-07-28 DIAGNOSIS — Z302 Encounter for sterilization: Secondary | ICD-10-CM

## 2023-07-28 LAB — CBC
HCT: 38.9 % (ref 36.0–46.0)
Hemoglobin: 12.6 g/dL (ref 12.0–15.0)
MCH: 28.4 pg (ref 26.0–34.0)
MCHC: 32.4 g/dL (ref 30.0–36.0)
MCV: 87.6 fL (ref 80.0–100.0)
Platelets: 341 10*3/uL (ref 150–400)
RBC: 4.44 MIL/uL (ref 3.87–5.11)
RDW: 12.9 % (ref 11.5–15.5)
WBC: 7.5 10*3/uL (ref 4.0–10.5)
nRBC: 0 % (ref 0.0–0.2)

## 2023-07-28 LAB — PREGNANCY, URINE: Preg Test, Ur: NEGATIVE

## 2023-07-29 NOTE — H&P (Signed)
 [facility] Attending History and Physical  Shannon Cole is a 33 y.o. [id number] who presents for scheduled laparoscopic bilateral salpingectomy for permanent sterilization.  In review, she has tried other forms of contraception and wishes to proceed with permanent sterilization as she does not desire to conceive.  Menses are regular each month.  Denies acute GYN concerns. Denies any abnormal vaginal discharge, fevers, chills, sweats, dysuria, nausea, vomiting, other GI or GU symptoms or other general symptoms.  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  No current facility-administered medications on file prior to encounter.   Current Outpatient Medications on File Prior to Encounter  Medication Sig Dispense Refill   clonazePAM (KLONOPIN) 0.5 MG tablet Take 1 tablet (0.5 mg total) by mouth 2 (two) times daily as needed for anxiety. 20 tablet 1   pantoprazole (PROTONIX) 40 MG tablet Take 1 tablet (40 mg total) by mouth daily. 90 tablet 1   acetaminophen (TYLENOL) 500 MG tablet Take 500-1,000 mg by mouth every 6 (six) hours as needed for moderate pain (pain score 4-6).     Allergies  Allergen Reactions   Penicillins Hives    Social History:   reports that she has never smoked. She has never used smokeless tobacco. She reports current alcohol use. She reports that she does not use drugs. Family History  Problem Relation Age of Onset    Diabetes Mother    Cancer Father        lung    Review of Systems: Pertinent items noted in HPI and remainder of comprehensive ROS otherwise negative.  PHYSICAL EXAM: Blood pressure 118/73, pulse 86, temperature 98.2 F (36.8 C), temperature source Oral, resp. rate 15, height 5\' 4"  (1.626 m), weight 70.3 kg, last menstrual period 07/11/2023, SpO2 100%. CONSTITUTIONAL: Well-developed, well-nourished female in no acute distress.  SKIN: Skin is warm and dry. No rash noted. Not diaphoretic. No erythema. No pallor. NEUROLOGIC: Alert and oriented to person, place, and time. Normal reflexes, muscle tone coordination. No cranial nerve deficit noted. PSYCHIATRIC: Normal mood and affect. Normal behavior. Normal judgment and thought content. CARDIOVASCULAR: Normal heart rate noted, regular rhythm RESPIRATORY: Effort and breath sounds normal, no problems with respiration noted ABDOMEN: Soft, nontender, nondistended. PELVIC: deferred MUSCULOSKELETAL: no calf tenderness bilaterally EXT: no edema bilaterally, normal pulses  Labs: Results for orders placed or performed during the hospital encounter of 07/28/23 (from the past 2 weeks)  CBC   Collection Time: 07/28/23  9:06 AM  Result Value Ref Range   WBC 7.5 4.0 - 10.5 K/uL   RBC 4.44 3.87 - 5.11 MIL/uL   Hemoglobin 12.6 12.0 - 15.0 g/dL   HCT 08.6 57.8 - 46.9 %   MCV 87.6 80.0 - 100.0 fL   MCH 28.4 26.0 - 34.0 pg   MCHC 32.4 30.0 - 36.0 g/dL   RDW 62.9 52.8 - 41.3 %   Platelets 341 150 - 400 K/uL  nRBC 0.0 0.0 - 0.2 %  Pregnancy, urine   Collection Time: 07/28/23  9:06 AM  Result Value Ref Range   Preg Test, Ur NEGATIVE NEGATIVE    Imaging Studies: No results found.  Assessment: Desires sterilization Plan: Laparoscopic bilateral salpingectomy -IV Toradol -NPO -LR @ 125cc/hr -SCDs to OR  Patient desires permanent sterilization.  Other reversible forms of contraception were discussed with patient; she declines all other  modalities. Risks of procedure discussed with patient including but not limited to: risk of regret, permanence of method, bleeding, infection, and potential injury to surrounding organs.  Discussed that especially in setting of salpingectomy, risk of ectopic pregnancy low, less than 1%.  Also discussed possibility of post-tubal pain syndrome. Patient verbalized understanding of these risks and wants to proceed with sterilization.  Written informed consent obtained.  To OR when ready.  Myna Hidalgo, DO Attending Obstetrician & Gynecologist, [facility] for [employer], [facility]

## 2023-07-31 NOTE — Anesthesia Preprocedure Evaluation (Signed)
 Anesthesia Evaluation  Patient identified by MRN, date of birth, ID band Patient awake    Reviewed: Allergy & Precautions, H&P , NPO status , Patient's Chart, lab work & pertinent test results, reviewed documented beta blocker date and time   Airway Mallampati: I  TM Distance: >3 FB Neck ROM: Full    Dental no notable dental hx. (+) Teeth Intact, Dental Advisory Given   Pulmonary neg pulmonary ROS   Pulmonary exam normal breath sounds clear to auscultation       Cardiovascular Exercise Tolerance: Good negative cardio ROS Normal cardiovascular exam Rhythm:Regular Rate:Normal     Neuro/Psych  PSYCHIATRIC DISORDERS Anxiety     PTSDnegative neurological ROS     GI/Hepatic negative GI ROS, Neg liver ROS,,,  Endo/Other  negative endocrine ROS    Renal/GU negative Renal ROS  negative genitourinary   Musculoskeletal   Abdominal   Peds  Hematology negative hematology ROS (+)   Anesthesia Other Findings   Reproductive/Obstetrics negative OB ROS                             Anesthesia Physical Anesthesia Plan  ASA: 2  Anesthesia Plan: General   Post-op Pain Management: Dilaudid IV   Induction: Intravenous  PONV Risk Score and Plan: Ondansetron, Dexamethasone, Midazolam and Scopolamine patch - Pre-op  Airway Management Planned: Oral ETT  Additional Equipment: None  Intra-op Plan:   Post-operative Plan: Extubation in OR  Informed Consent: I have reviewed the patients History and Physical, chart, labs and discussed the procedure including the risks, benefits and alternatives for the proposed anesthesia with the patient or authorized representative who has indicated his/her understanding and acceptance.     Dental advisory given  Plan Discussed with: CRNA  Anesthesia Plan Comments:         Anesthesia Quick Evaluation

## 2023-08-01 ENCOUNTER — Other Ambulatory Visit: Payer: Self-pay

## 2023-08-01 ENCOUNTER — Ambulatory Visit (HOSPITAL_COMMUNITY): Payer: Self-pay | Admitting: Anesthesiology

## 2023-08-01 ENCOUNTER — Ambulatory Visit (HOSPITAL_COMMUNITY)
Admission: RE | Admit: 2023-08-01 | Discharge: 2023-08-01 | Disposition: A | Discharge status: Home / Self Care | Payer: BC Managed Care – PPO | Attending: Obstetrics & Gynecology | Admitting: Obstetrics & Gynecology

## 2023-08-01 ENCOUNTER — Encounter (HOSPITAL_COMMUNITY): Payer: Self-pay | Admitting: Obstetrics & Gynecology

## 2023-08-01 ENCOUNTER — Encounter (HOSPITAL_COMMUNITY)
Admission: RE | Disposition: A | Discharge status: Home / Self Care | Payer: Self-pay | Source: Home / Self Care | Attending: Obstetrics & Gynecology

## 2023-08-01 DIAGNOSIS — Z302 Encounter for sterilization: Secondary | ICD-10-CM | POA: Diagnosis not present

## 2023-08-01 DIAGNOSIS — Z01818 Encounter for other preprocedural examination: Secondary | ICD-10-CM

## 2023-08-01 DIAGNOSIS — F419 Anxiety disorder, unspecified: Secondary | ICD-10-CM | POA: Diagnosis not present

## 2023-08-01 DIAGNOSIS — F4310 Post-traumatic stress disorder, unspecified: Secondary | ICD-10-CM | POA: Insufficient documentation

## 2023-08-01 HISTORY — PX: LAPAROSCOPIC BILATERAL SALPINGECTOMY: SHX5889

## 2023-08-01 SURGERY — SALPINGECTOMY, BILATERAL, LAPAROSCOPIC
Anesthesia: General | Site: Abdomen | Laterality: Bilateral

## 2023-08-01 MED ORDER — LIDOCAINE HCL (CARDIAC) PF 50 MG/5ML IV SOSY: PREFILLED_SYRINGE | INTRAVENOUS | Status: DC | PRN

## 2023-08-01 MED ORDER — FENTANYL CITRATE (PF) 100 MCG/2ML IJ SOLN
INTRAMUSCULAR | Status: AC
  Filled 2023-08-01: qty 2

## 2023-08-01 MED ORDER — ACETAMINOPHEN 160 MG/5ML PO SOLN
960.0000 mg | Freq: Once | ORAL | Status: AC
  Filled 2023-08-01: qty 30

## 2023-08-01 MED ORDER — BUPIVACAINE HCL (PF) 0.25 % IJ SOLN
INTRAMUSCULAR | Status: AC
  Filled 2023-08-01: qty 60

## 2023-08-01 MED ORDER — LIDOCAINE HCL (CARDIAC) PF 100 MG/5ML IV SOSY
PREFILLED_SYRINGE | INTRAVENOUS | Status: DC | PRN
  Administered 2023-08-01: 60 mg via INTRATRACHEAL

## 2023-08-01 MED ORDER — IBUPROFEN 600 MG PO TABS: 600.0000 mg | ORAL_TABLET | Freq: Four times a day (QID) | ORAL | 0 refills | Status: AC | PRN

## 2023-08-01 MED ORDER — KETOROLAC TROMETHAMINE 30 MG/ML IJ SOLN
INTRAMUSCULAR | Status: AC
  Filled 2023-08-01: qty 1

## 2023-08-01 MED ORDER — SUGAMMADEX SODIUM 200 MG/2ML IV SOLN
INTRAVENOUS | Status: DC | PRN
  Administered 2023-08-01: 200 mg via INTRAVENOUS

## 2023-08-01 MED ORDER — SODIUM CHLORIDE 0.9 % IR SOLN
Status: DC | PRN
  Administered 2023-08-01: 1000 mL

## 2023-08-01 MED ORDER — SCOPOLAMINE 1 MG/3DAYS TD PT72
MEDICATED_PATCH | TRANSDERMAL | Status: AC
  Filled 2023-08-01: qty 1

## 2023-08-01 MED ORDER — ACETAMINOPHEN 500 MG PO TABS
1000.0000 mg | ORAL_TABLET | Freq: Once | ORAL | Status: AC
  Administered 2023-08-01: 1000 mg via ORAL

## 2023-08-01 MED ORDER — FENTANYL CITRATE (PF) 100 MCG/2ML IJ SOLN: INTRAMUSCULAR | Status: DC | PRN

## 2023-08-01 MED ORDER — OXYCODONE HCL 5 MG/5ML PO SOLN: 5.0000 mg | Freq: Once | ORAL | Status: DC | PRN

## 2023-08-01 MED ORDER — CHLORHEXIDINE GLUCONATE 0.12 % MT SOLN
15.0000 mL | Freq: Once | OROMUCOSAL | Status: AC
  Administered 2023-08-01: 15 mL via OROMUCOSAL

## 2023-08-01 MED ORDER — LACTATED RINGERS IV SOLN: INTRAVENOUS | Status: DC

## 2023-08-01 MED ORDER — FENTANYL CITRATE (PF) 100 MCG/2ML IJ SOLN
INTRAMUSCULAR | Status: DC | PRN
Start: 2023-08-01 — End: 2023-08-01
  Administered 2023-08-01: 50 ug via INTRAVENOUS

## 2023-08-01 MED ORDER — LIDOCAINE HCL (PF) 2 % IJ SOLN
INTRAMUSCULAR | Status: AC
  Filled 2023-08-01: qty 5

## 2023-08-01 MED ORDER — ORAL CARE MOUTH RINSE: 15.0000 mL | Freq: Once | OROMUCOSAL | Status: AC

## 2023-08-01 MED ORDER — ROCURONIUM BROMIDE 10 MG/ML (PF) SYRINGE
PREFILLED_SYRINGE | INTRAVENOUS | Status: AC
  Filled 2023-08-01: qty 10

## 2023-08-01 MED ORDER — SILVER NITRATE-POT NITRATE 75-25 % EX MISC
CUTANEOUS | Status: DC | PRN
  Administered 2023-08-01: 1

## 2023-08-01 MED ORDER — MIDAZOLAM HCL 2 MG/2ML IJ SOLN
INTRAMUSCULAR | Status: AC
Start: 2023-08-01 — End: ?
  Filled 2023-08-01: qty 2

## 2023-08-01 MED ORDER — SCOPOLAMINE 1 MG/3DAYS TD PT72
1.0000 | MEDICATED_PATCH | Freq: Once | TRANSDERMAL | Status: DC
  Administered 2023-08-01: 1.5 mg via TRANSDERMAL

## 2023-08-01 MED ORDER — DEXAMETHASONE SODIUM PHOSPHATE 10 MG/ML IJ SOLN
INTRAMUSCULAR | Status: DC | PRN
  Administered 2023-08-01: 8 mg via INTRAVENOUS

## 2023-08-01 MED ORDER — PROPOFOL 10 MG/ML IV BOLUS
INTRAVENOUS | Status: DC | PRN
  Administered 2023-08-01: 120 mg via INTRAVENOUS

## 2023-08-01 MED ORDER — MIDAZOLAM HCL 2 MG/2ML IJ SOLN
INTRAMUSCULAR | Status: AC
  Filled 2023-08-01: qty 2

## 2023-08-01 MED ORDER — KETOROLAC TROMETHAMINE 30 MG/ML IJ SOLN
30.0000 mg | INTRAMUSCULAR | Status: AC
  Administered 2023-08-01: 30 mg via INTRAVENOUS

## 2023-08-01 MED ORDER — PROPOFOL 500 MG/50ML IV EMUL
INTRAVENOUS | Status: AC
Start: 2023-08-01 — End: ?
  Filled 2023-08-01: qty 50

## 2023-08-01 MED ORDER — HYDROMORPHONE HCL 1 MG/ML IJ SOLN: 0.2500 mg | INTRAMUSCULAR | Status: DC | PRN

## 2023-08-01 MED ORDER — POVIDONE-IODINE 10 % EX SWAB
2.0000 | Freq: Once | CUTANEOUS | Status: AC
  Administered 2023-08-01: 2 via TOPICAL

## 2023-08-01 MED ORDER — BUPIVACAINE HCL (PF) 0.25 % IJ SOLN
INTRAMUSCULAR | Status: DC | PRN
  Administered 2023-08-01: 40 mL

## 2023-08-01 MED ORDER — BUPIVACAINE HCL (PF) 0.5 % IJ SOLN
INTRAMUSCULAR | Status: AC
Start: 2023-08-01 — End: ?
  Filled 2023-08-01: qty 30

## 2023-08-01 MED ORDER — SODIUM CHLORIDE 0.9 % IV SOLN
12.5000 mg | INTRAVENOUS | Status: DC | PRN
  Administered 2023-08-01: 12.5 mg via INTRAVENOUS
  Filled 2023-08-01: qty 0.5

## 2023-08-01 MED ORDER — ONDANSETRON 4 MG PO TBDP
4.0000 mg | ORAL_TABLET | Freq: Three times a day (TID) | ORAL | 0 refills | Status: DC | PRN
Start: 2023-08-01 — End: 2024-03-25

## 2023-08-01 MED ORDER — MIDAZOLAM HCL 2 MG/2ML IJ SOLN
INTRAMUSCULAR | Status: DC | PRN
  Administered 2023-08-01: 2 mg via INTRAVENOUS

## 2023-08-01 MED ORDER — ONDANSETRON HCL 4 MG/2ML IJ SOLN
INTRAMUSCULAR | Status: DC | PRN
  Administered 2023-08-01: 4 mg via INTRAVENOUS

## 2023-08-01 MED ORDER — DOCUSATE SODIUM 100 MG PO CAPS: 100.0000 mg | ORAL_CAPSULE | Freq: Two times a day (BID) | ORAL | 0 refills | Status: AC

## 2023-08-01 MED ORDER — ACETAMINOPHEN 500 MG PO TABS
ORAL_TABLET | ORAL | Status: AC
  Filled 2023-08-01: qty 2

## 2023-08-01 MED ORDER — OXYCODONE HCL 5 MG PO TABS: 5.0000 mg | ORAL_TABLET | Freq: Once | ORAL | Status: DC | PRN

## 2023-08-01 MED ORDER — ROCURONIUM BROMIDE 100 MG/10ML IV SOLN
INTRAVENOUS | Status: DC | PRN
  Administered 2023-08-01: 5 mg via INTRAVENOUS
  Administered 2023-08-01: 35 mg via INTRAVENOUS

## 2023-08-01 MED ORDER — OXYCODONE-ACETAMINOPHEN 5-325 MG PO TABS: 1.0000 | ORAL_TABLET | Freq: Four times a day (QID) | ORAL | 0 refills | Status: AC | PRN

## 2023-08-01 SURGICAL SUPPLY — 31 items
BLADE SURG SZ11 CARB STEEL (BLADE) ×1 IMPLANT
CHLORAPREP W/TINT 26 (MISCELLANEOUS) ×1 IMPLANT
CLOTH BEACON ORANGE TIMEOUT ST (SAFETY) ×1 IMPLANT
COVER LIGHT HANDLE STERIS (MISCELLANEOUS) ×2 IMPLANT
DERMABOND ADVANCED .7 DNX12 (GAUZE/BANDAGES/DRESSINGS) ×1 IMPLANT
GAUZE 4X4 16PLY ~~LOC~~+RFID DBL (SPONGE) ×2 IMPLANT
GLOVE BIO SURGEON STRL SZ 6.5 (GLOVE) ×1 IMPLANT
GLOVE BIOGEL PI IND STRL 7.0 (GLOVE) ×3 IMPLANT
GOWN STRL REUS W/ TWL LRG LVL3 (GOWN DISPOSABLE) ×2 IMPLANT
KIT TURNOVER CYSTO (KITS) ×1 IMPLANT
KIT TURNOVER KIT A (KITS) ×1 IMPLANT
NDL HYPO 25X1 1.5 SAFETY (NEEDLE) ×1 IMPLANT
NDL INSUFFLATION 14GA 120MM (NEEDLE) ×1 IMPLANT
NEEDLE HYPO 25X1 1.5 SAFETY (NEEDLE) ×1 IMPLANT
NEEDLE INSUFFLATION 14GA 120MM (NEEDLE) ×1 IMPLANT
NS IRRIG 1000ML POUR BTL (IV SOLUTION) ×1 IMPLANT
PACK PERI GYN (CUSTOM PROCEDURE TRAY) ×1 IMPLANT
PAD ARMBOARD POSITIONER FOAM (MISCELLANEOUS) ×1 IMPLANT
POSITIONER HEAD 8X9X4 ADT (SOFTGOODS) ×1 IMPLANT
SET BASIN LINEN APH (SET/KITS/TRAYS/PACK) ×1 IMPLANT
SET TUBE SMOKE EVAC HIGH FLOW (TUBING) ×1 IMPLANT
SHEARS HARMONIC ACE PLUS 36CM (ENDOMECHANICALS) ×1 IMPLANT
SLEEVE Z-THREAD 5X100MM (TROCAR) ×1 IMPLANT
SOL ANTI FOG 6CC (MISCELLANEOUS) ×1 IMPLANT
SUT MON AB 4-0 PS1 27 (SUTURE) ×1 IMPLANT
SYR 10ML LL (SYRINGE) ×1 IMPLANT
SYR CONTROL 10ML LL (SYRINGE) ×1 IMPLANT
TRAY FOLEY W/BAG SLVR 16FR ST (SET/KITS/TRAYS/PACK) ×1 IMPLANT
TROCAR KII 8X100ML NONTHREADED (TROCAR) ×1 IMPLANT
TROCAR Z-THREAD FIOS 5X100MM (TROCAR) ×1 IMPLANT
WARMER LAPAROSCOPE (MISCELLANEOUS) ×1 IMPLANT

## 2023-08-01 NOTE — Op Note (Signed)
 PREOPERATIVE DIAGNOSIS:  Desires sterilization POSTOPERATIVE DIAGNOSIS: same PROCEDURE PERFORMED: Laparoscopic bilateral salpingectomy SURGEON: Dr. Myna Hidalgo ANESTHESIA: General endotracheal.  ESTIMATED BLOOD LOSS: 5cc URINE OUTPUT: 400cc IV FLUIDS: 1000cc SPECIMEN(S): bilateral fallopian tubes COMPLICATIONS: None.  CONDITION: Stable.   FINDINGS: No ascites or peritoneal studding was appreciated.  Normal appearing bowel. Uterus normal size and shape.  Ovaries and tubes were normal in appearance.    Informed consent was obtained from the patient prior to taking her to the operating room where anesthesia was found to be adequate. She was placed in dorsal lithotomy position and examined under anesthesia. She was prepped and draped in normal sterile fashion. The bladder was drained under sterile technique.  A bi-valve speculum was then placed and the anterior lip of the cervix was grasped with the single tooth tenaculum. The hulka uterine manipulator was then advanced into the uterus to provide uterine mobility. The speculum and tenaculum were then removed.  Attention was then turned to the patient's abdomen.  A 5 mm umbilical skin incision was made with the scalpel. The veress needle was carefully introduced into the peritoneal cavity while tenting the abdominal wall. Intraperitoneal placement was confirmed by use of a saline-drop test.  The gas was connected and confirmed intrabdominal placement by a low initial pressure of . The abdomen was then insuflated with CO2 gas. The trocar and sleeve were then advanced without difficulty into the abdomen under direct visualization. Intraabdominal placement was confirmed by the laparoscope and surveillance of the abdomen was performed. Grossly normal appearing abdomen as mentioned in findings above.  Two additional ports were placed.  An 8mm trocar on her left lower quadrant and an 5mm at her right lower quadrent.  Each trocar was placed under direct  visualization.      The right fallopian tube was placed on tension and using the Harmonic, the tube was excised and removed through the port under direct visualization.  A similar procedure was performed on the left with removal of the fallopian tube. Excellent hemostasis was noted.  Under direct visualization TAP block was completed under direct visualization using 10cc of 0.25% marcaine in each of four locations.  The instruments were then removed from the patients abdomen with air allowed to fully escape. All port sites were closed with monocryl and dermabond.  The manipulator was removed from the cervix, hemostasis was achieved with pressure and silver nitrazine and no lacerations identified. The patient tolerated the procedure well with all sponge, lap, and needle counts correct. The patient was taken to recovery in stable condition.  Myna Hidalgo, DO Attending Obstetrician & Gynecologist, [facility] for [employer], [facility]

## 2023-08-01 NOTE — Discharge Instructions (Addendum)
 HOME INSTRUCTIONS  Please note any unusual or excessive bleeding, pain, swelling. Mild dizziness or drowsiness are normal for about 24 hours after surgery.   Shower when comfortable  Restrictions: No driving for 24 hours or while taking pain medications.  Activity:  No heavy lifting (> 20 lbs), nothing in vagina (no tampons, douching, or intercourse) x 4 weeks; no tub baths for 4 weeks Vaginal spotting is expected but if your bleeding is heavy, period like,  please call the office   Incision: the bandaids will fall off when they are ready to; you may clean your incision with mild soap and water but do not rub or scrub the incision site.  You may experience slight bloody drainage from your incision periodically.  This is normal.  If you experience a large amount of drainage or the incision opens, please call your physician who will likely direct you to the emergency department.  Diet:  You may return to your regular diet.  Do not eat large meals.  Eat small frequent meals throughout the day.  Continue to drink a good amount of water at least 6-8 glasses of water per day, hydration is very important for the healing process.  Pain Management: Take Ibuprofen every 6 hours with food as needed for pain.  For moderate to severe pain, a prescription of percocet has been sent in.  You can alternate this medication with the ibuprofen.   -If the Percocet is too strong, switch to over the counter tylenol instead.   -Do not take Percocet (oxycodone/acetaminophen) and Tylenol (acetaminophen) together (percocet has tylenol in it).  Always take prescription pain medication with food.  Percocet may cause constipation, you may want to take a stool softener while taking this medication.  A prescription of colace has been sent in to take twice daily if needed while taking the oxycodone.  Be sure to drink plenty of fluids and increase your fiber to help with constipation.  Alcohol -- Avoid for 24 hours and while  taking pain medications.  Nausea: Take sips of ginger ale or soda.  A prescription of zofran for nausea has also been sent in to take if needed.  Fever -- Call physician if temperature over 101 degrees  Follow up:  If you do not already have a follow up appointment scheduled, please call the office at [phone].  If you experience fever (a temperature greater than 100.4), pain unrelieved by pain medication, shortness of breath, swelling of a single leg, or any other symptoms which are concerning to you please the office immediately.

## 2023-08-01 NOTE — Anesthesia Postprocedure Evaluation (Signed)
 Anesthesia Post Note  Patient: Shannon Cole  Procedure(s) Performed: SALPINGECTOMY, BILATERAL, LAPAROSCOPIC (Bilateral: Abdomen)  Patient location during evaluation: PACU Anesthesia Type: General Level of consciousness: awake and alert Pain management: pain level controlled Vital Signs Assessment: post-procedure vital signs reviewed and stable Respiratory status: spontaneous breathing, nonlabored ventilation, respiratory function stable and patient connected to nasal cannula oxygen Cardiovascular status: blood pressure returned to baseline and stable Postop Assessment: no apparent nausea or vomiting Anesthetic complications: no   There were no known notable events for this encounter.   Last Vitals:  Vitals:   08/01/23 0939 08/01/23 0951  BP: 129/80   Pulse: 77   Resp: 16   Temp: 37.1 C   SpO2: 100% 99%    Last Pain:  Vitals:   08/01/23 0939  TempSrc: Oral  PainSc: 5                  [name] L Tyleah Loh

## 2023-08-01 NOTE — Transfer of Care (Signed)
 Immediate Anesthesia Transfer of Care Note  Patient: Shannon Cole  Procedure(s) Performed: SALPINGECTOMY, BILATERAL, LAPAROSCOPIC (Bilateral)  Patient Location: PACU  Anesthesia Type:General  Level of Consciousness: awake  Airway & Oxygen Therapy: Patient Spontanous Breathing  Post-op Assessment: Report given to RN  Post vital signs: Reviewed and stable  Last Vitals:  Vitals Value Taken Time  BP 115/86 08/01/23 0841  Temp    Pulse 93 08/01/23 0843  Resp 12 08/01/23 0843  SpO2 100 % 08/01/23 0843  Vitals shown include unfiled device data.  Last Pain:  Vitals:   08/01/23 0729  TempSrc:   PainSc: 0-No pain         Complications: No notable events documented.

## 2023-08-02 ENCOUNTER — Encounter (HOSPITAL_COMMUNITY): Payer: Self-pay | Admitting: Obstetrics & Gynecology

## 2023-08-02 ENCOUNTER — Encounter: Payer: Self-pay | Admitting: Obstetrics & Gynecology

## 2023-08-02 LAB — SURGICAL PATHOLOGY

## 2023-08-09 ENCOUNTER — Ambulatory Visit: Payer: BC Managed Care – PPO | Admitting: Obstetrics & Gynecology

## 2023-08-09 ENCOUNTER — Encounter: Payer: Self-pay | Admitting: Obstetrics & Gynecology

## 2023-08-09 VITALS — BP 114/74 | HR 73 | Wt 157.4 lb

## 2023-08-09 DIAGNOSIS — Z4889 Encounter for other specified surgical aftercare: Secondary | ICD-10-CM

## 2023-08-09 NOTE — Progress Notes (Signed)
    PostOp Visit Note  Shannon Cole is a 33 y.o. G0P0000 female who presents for a postoperative visit. She is 1 week postop following a laparoscopic bilateral salpingectomy completed on 4/3   Today she notes that she is doing well.  Had some intense shoulder pain- Day 2-3 but much improved. Denies fever or chills.  Tolerating gen diet.  +Flatus, Regular BMs.  Pain is well controlled, only took oxycodone first day Overall doing well and reports no acute complaints  Currently on menses, day 4 without any issues.  Review of Systems Pertinent items are noted in HPI.    Objective:  BP 114/74 (BP Location: Right Arm, Patient Position: Sitting, Cuff Size: Normal)   Pulse 73   Wt 157 lb 6.4 oz (71.4 kg)   LMP 07/11/2023   BMI 27.02 kg/m    Physical Examination:  GENERAL ASSESSMENT: well developed and well nourished SKIN: normal color, no lesions CHEST: normal air exchange, respiratory effort normal with no retractions HEART: regular rate and rhythm ABDOMEN: soft, non-distended, +BS INCISION: C/D/I- healing appropriately with dermabond EXTREMITY: no edema, no calf tenderness bilaterally PSYCH: mood appropriate, normal affect       Assessment:    Postop visit   Plan:   Meeting milestones appropriately Continued pelvic rest Okay to return to work  Shannon Hidalgo, DO Attending Obstetrician & Gynecologist, Biochemist, clinical for [employer], [facility]

## 2023-08-30 ENCOUNTER — Other Ambulatory Visit (HOSPITAL_COMMUNITY): Payer: Self-pay

## 2023-08-30 ENCOUNTER — Other Ambulatory Visit: Payer: Self-pay | Admitting: Family Medicine

## 2023-08-30 MED ORDER — PANTOPRAZOLE SODIUM 40 MG PO TBEC
40.0000 mg | DELAYED_RELEASE_TABLET | Freq: Every day | ORAL | 1 refills | Status: AC
  Filled 2023-08-30 – 2023-08-31 (×2): qty 90, 90d supply, fill #0

## 2023-08-31 ENCOUNTER — Other Ambulatory Visit: Payer: Self-pay

## 2023-08-31 ENCOUNTER — Other Ambulatory Visit (HOSPITAL_COMMUNITY): Payer: Self-pay

## 2024-03-25 ENCOUNTER — Telehealth: Admitting: Emergency Medicine

## 2024-03-25 ENCOUNTER — Telehealth: Admitting: Physician Assistant

## 2024-03-25 DIAGNOSIS — A084 Viral intestinal infection, unspecified: Secondary | ICD-10-CM | POA: Diagnosis not present

## 2024-03-25 DIAGNOSIS — R197 Diarrhea, unspecified: Secondary | ICD-10-CM

## 2024-03-25 MED ORDER — ONDANSETRON 4 MG PO TBDP: 4.0000 mg | ORAL_TABLET | Freq: Three times a day (TID) | ORAL | 0 refills | Status: AC | PRN

## 2024-03-25 MED ORDER — DICYCLOMINE HCL 10 MG PO CAPS: 10.0000 mg | ORAL_CAPSULE | Freq: Three times a day (TID) | ORAL | 0 refills | Status: AC

## 2024-03-25 NOTE — Patient Instructions (Signed)
 Shannon Cole, thank you for joining Delon CHRISTELLA Dickinson, PA-C for today's virtual visit.  While this provider is not your primary care provider (PCP), if your PCP is located in our provider database this encounter information will be shared with them immediately following your visit.   A [facility] MyChart account gives you access to today's visit and all your visits, tests, and labs performed at [facility]  click here if you don't have a [facility] MyChart account or go to [email or web address]  Consent: (Patient) Shannon Cole provided verbal consent for this virtual visit at the beginning of the encounter.  Current Medications:  Current Outpatient Medications:    dicyclomine  (BENTYL ) 10 MG capsule, Take 1 capsule (10 mg total) by mouth 4 (four) times daily -  before meals and at bedtime., Disp: 28 capsule, Rfl: 0   ondansetron  (ZOFRAN -ODT) 4 MG disintegrating tablet, Take 1-2 tablets (4-8 mg total) by mouth every 8 (eight) hours as needed., Disp: 20 tablet, Rfl: 0   clonazePAM  (KLONOPIN ) 0.5 MG tablet, Take 1 tablet (0.5 mg total) by mouth 2 (two) times daily as needed for anxiety., Disp: 20 tablet, Rfl: 1   ibuprofen  (ADVIL ) 600 MG tablet, Take 1 tablet (600 mg total) by mouth every 6 (six) hours as needed. (Patient not taking: Reported on 08/09/2023), Disp: 30 tablet, Rfl: 0   pantoprazole  (PROTONIX ) 40 MG tablet, Take 1 tablet (40 mg total) by mouth daily., Disp: 90 tablet, Rfl: 1   Medications ordered in this encounter:  Meds ordered this encounter  Medications   ondansetron  (ZOFRAN -ODT) 4 MG disintegrating tablet    Sig: Take 1-2 tablets (4-8 mg total) by mouth every 8 (eight) hours as needed.    Dispense:  20 tablet    Refill:  0    Supervising Provider:   LAMPTEY, PHILIP O [[id number]]   dicyclomine  (BENTYL ) 10 MG capsule    Sig: Take 1 capsule (10 mg total) by mouth 4 (four) times daily -  before meals and at bedtime.    Dispense:  28 capsule    Refill:   0    Supervising Provider:   BLAISE ALEENE KIDD [[id number]]     *If you need refills on other medications prior to your next appointment, please contact your pharmacy*  Follow-Up: Call back or seek an in-person evaluation if the symptoms worsen or if the condition fails to improve as anticipated.   Virtual Care [phone]  Other Instructions Viral Gastroenteritis, Adult  Viral gastroenteritis is also known as the stomach flu. This condition may affect your stomach, small intestine, and large intestine. It can cause sudden watery diarrhea, fever, and vomiting. This condition is caused by many different viruses. These viruses can be passed from person to person very easily (are contagious). Diarrhea and vomiting can make you feel weak and cause you to become dehydrated. You may not be able to keep fluids down. Dehydration can make you tired and thirsty, cause you to have a dry mouth, and decrease how often you urinate. It is important to replace the fluids that you lose from diarrhea and vomiting. What are the causes? Gastroenteritis is caused by many viruses, including rotavirus and norovirus. Norovirus is the most common cause in adults. You can get sick after being exposed to the viruses from other people. You can also get sick by: Eating food, drinking water, or touching a surface contaminated with one of these viruses. Sharing utensils or other personal items with an infected person.  What increases the risk? You are more likely to develop this condition if you: Have a weak body defense system (immune system). Live with one or more children who are younger than 2 years. Live in a nursing home. Travel on cruise ships. What are the signs or symptoms? Symptoms of this condition start suddenly 1-3 days after exposure to a virus. Symptoms may last for a few days or for as long as a week. Common symptoms include watery diarrhea and vomiting. Other symptoms  include: Fever. Headache. Fatigue. Pain in the abdomen. Chills. Weakness. Nausea. Muscle aches. Loss of appetite. How is this diagnosed? This condition is diagnosed with a medical history and physical exam. You may also have a stool test to check for viruses or other infections. How is this treated? This condition typically goes away on its own. The focus of treatment is to prevent dehydration and restore lost fluids (rehydration). This condition may be treated with: An oral rehydration solution (ORS) to replace important salts and minerals (electrolytes) in your body. Take this if told by your health care provider. This is a drink that is sold at pharmacies and retail stores. Medicines to help with your symptoms. Probiotic supplements to reduce symptoms of diarrhea. Fluids given through an IV, if dehydration is severe. Older adults and people with other diseases or a weak immune system are at higher risk for dehydration. Follow these instructions at home: Eating and drinking  Take an ORS as told by your health care provider. Drink clear fluids in small amounts as you are able. Clear fluids include: Water. Ice chips. Diluted fruit juice. Low-calorie sports drinks. Drink enough fluid to keep your urine pale yellow. Eat small amounts of healthy foods every 3-4 hours as you are able. This may include whole grains, fruits, vegetables, lean meats, and yogurt. Avoid fluids that contain a lot of sugar or caffeine, such as energy drinks, sports drinks, and soda. Avoid spicy or fatty foods. Avoid alcohol. General instructions  Wash your hands often, especially after having diarrhea or vomiting. If soap and water are not available, use hand sanitizer. Make sure that all people in your household wash their hands well and often. Take over-the-counter and prescription medicines only as told by your health care provider. Rest at home while you recover. Watch your condition for any  changes. Take a warm bath to relieve any burning or pain from frequent diarrhea episodes. Keep all follow-up visits. This is important. Contact a health care provider if you: Cannot keep fluids down. Have symptoms that get worse. Have new symptoms. Feel light-headed or dizzy. Have muscle cramps. Get help right away if you: Have chest pain. Have trouble breathing or you are breathing very quickly. Have a fast heartbeat. Feel extremely weak or you faint. Have a severe headache, a stiff neck, or both. Have a rash. Have severe pain, cramping, or bloating in your abdomen. Have skin that feels cold and clammy. Feel confused. Have pain when you urinate. Have signs of dehydration, such as: Dark urine, very little urine, or no urine. Cracked lips. Dry mouth. Sunken eyes. Sleepiness. Weakness. Have signs of bleeding, such as: Seeing blood in your vomit. Having vomit that looks like coffee grounds. Having bloody or black stools or stools that look like tar. These symptoms may be an emergency. Get help right away. Call 911. Do not wait to see if the symptoms will go away. Do not drive yourself to the hospital. Summary Viral gastroenteritis is also known as the stomach  flu. It can cause sudden watery diarrhea, fever, and vomiting. This condition can be passed from person to person very easily (is contagious). Take an oral rehydration solution (ORS) if told by your health care provider. This is a drink that is sold at pharmacies and retail stores. Wash your hands often, especially after having diarrhea or vomiting. If soap and water are not available, use hand sanitizer. This information is not intended to replace advice given to you by your health care provider. Make sure you discuss any questions you have with your health care provider. Document Revised: 02/15/2021 Document Reviewed: 02/15/2021 Elsevier Patient Education  2024 Elsevier Inc.   If you have been instructed to have an  in-person evaluation today at a local Urgent Care facility, please use the link below. It will take you to a list of all of our available [facility], including address, phone number and hours of operation. Please do not delay care.  [facility]  If you or a family member do not have a primary care provider, use the link below to schedule a visit and establish care. When you choose a [facility] primary care physician or advanced practice provider, you gain a long-term partner in health. Find a Primary Care Provider  Learn more about [facility] in-office and virtual care options: [facility] - Get Care Now

## 2024-03-25 NOTE — Progress Notes (Signed)
   Thank you for the details you included in the comment boxes. Those details are very helpful in determining the best course of treatment for you and help us  to provide the best care. Because of your abdominal pain, we recommend that you schedule a Virtual Urgent Care video visit in order for the provider to better assess what is going on.  The provider will be able to give you a more accurate diagnosis and treatment plan if we can more freely discuss your symptoms and with the addition of a virtual examination.    If you change your visit to a video visit, we will bill your insurance (similar to an office visit) and you will not be charged for this e-Visit. You will be able to stay at home and speak with the first available [facility] advanced practice provider. The link to do a video visit is in the drop down Menu tab of your Welcome screen in MyChart.    Alternatively you can been in person at an Urgent Care clinic.  If you are having a true medical emergency, please call 911.     For an urgent face to face visit, [facility] has multiple urgent care centers for your convenience.  Click the link below for the full list of locations and hours, walk-in wait times, appointment scheduling options and driving directions:  Urgent Care - [place], [place], [place], [place], [place], [place]  [facility]

## 2024-03-25 NOTE — Progress Notes (Signed)
 Virtual Visit Consent   [name], you are scheduled for a virtual visit with a [facility] provider today. Just as with appointments in the office, your consent must be obtained to participate. Your consent will be active for this visit and any virtual visit you may have with one of our providers in the next 365 days. If you have a MyChart account, a copy of this consent can be sent to you electronically.  As this is a virtual visit, video technology does not allow for your provider to perform a traditional examination. This may limit your provider's ability to fully assess your condition. If your provider identifies any concerns that need to be evaluated in person or the need to arrange testing (such as labs, EKG, etc.), we will make arrangements to do so. Although advances in technology are sophisticated, we cannot ensure that it will always work on either your end or our end. If the connection with a video visit is poor, the visit may have to be switched to a telephone visit. With either a video or telephone visit, we are not always able to ensure that we have a secure connection.  By engaging in this virtual visit, you consent to the provision of healthcare and authorize for your insurance to be billed (if applicable) for the services provided during this visit. Depending on your insurance coverage, you may receive a charge related to this service.  I need to obtain your verbal consent now. Are you willing to proceed with your visit today? Shannon Cole has provided verbal consent on 03/25/2024 for a virtual visit (video or telephone). Shannon CHRISTELLA Dickinson, PA-C  Date: 03/25/2024 6:11 PM   Virtual Visit via Video Note   I, Shannon Cole, connected with  Shannon Cole  ([id number], January 01, 1991) on 03/25/24 at  6:00 PM EST by a video-enabled telemedicine application and verified that I am speaking with the correct person using two identifiers.  Location: Patient: Virtual Visit  Location Patient: Home Provider: Virtual Visit Location Provider: Home Office   I discussed the limitations of evaluation and management by telemedicine and the availability of in person appointments. The patient expressed understanding and agreed to proceed.    History of Present Illness: Shannon Cole is a 33 y.o. who identifies as a female who was assigned female at birth, and is being seen today for nausea and diarrhea.  HPI: URI  This is a new problem. The current episode started yesterday. The maximum temperature recorded prior to her arrival was 101 - 101.9 F (101.4). The fever has been present for Less than 1 day. Associated symptoms include diarrhea (watery, denies hematochezia or melena), headaches and nausea. Pertinent negatives include no chest pain, congestion, ear pain, plugged ear sensation, rhinorrhea, sinus pain, sore throat or vomiting. Associated symptoms comments: Body aches. She has tried acetaminophen  for the symptoms. The treatment provided no relief.     Problems:  Patient Active Problem List   Diagnosis Date Noted   Encounter for sterilization 08/01/2023   Dysmenorrhea 05/30/2023   Menorrhagia with regular cycle 05/30/2023   Encounter for gynecological examination with Papanicolaou smear of cervix 05/30/2023   Gallstones 01/19/2022   Epigastric pain 12/09/2021   Anxiety 05/14/2021   PTSD (post-traumatic stress disorder) 08/18/2015   Allergic rhinitis 12/17/2014    Allergies:  Allergies  Allergen Reactions   Penicillins Hives   Medications:  Current Outpatient Medications:    dicyclomine  (BENTYL ) 10 MG capsule, Take 1 capsule (10 mg  total) by mouth 4 (four) times daily -  before meals and at bedtime., Disp: 28 capsule, Rfl: 0   ondansetron  (ZOFRAN -ODT) 4 MG disintegrating tablet, Take 1-2 tablets (4-8 mg total) by mouth every 8 (eight) hours as needed., Disp: 20 tablet, Rfl: 0   clonazePAM  (KLONOPIN ) 0.5 MG tablet, Take 1 tablet (0.5 mg total) by mouth 2  (two) times daily as needed for anxiety., Disp: 20 tablet, Rfl: 1   ibuprofen  (ADVIL ) 600 MG tablet, Take 1 tablet (600 mg total) by mouth every 6 (six) hours as needed. (Patient not taking: Reported on 08/09/2023), Disp: 30 tablet, Rfl: 0   pantoprazole  (PROTONIX ) 40 MG tablet, Take 1 tablet (40 mg total) by mouth daily., Disp: 90 tablet, Rfl: 1  Observations/Objective: Patient is well-developed, well-nourished in no acute distress.  Resting comfortably at home.  Head is normocephalic, atraumatic.  No labored breathing.  Speech is clear and coherent with logical content.  Patient is alert and oriented at baseline.    Assessment and Plan: 1. Viral gastroenteritis (Primary) - ondansetron  (ZOFRAN -ODT) 4 MG disintegrating tablet; Take 1-2 tablets (4-8 mg total) by mouth every 8 (eight) hours as needed.  Dispense: 20 tablet; Refill: 0 - dicyclomine  (BENTYL ) 10 MG capsule; Take 1 capsule (10 mg total) by mouth 4 (four) times daily -  before meals and at bedtime.  Dispense: 28 capsule; Refill: 0  - Suspect viral gastroenteritis - Zofran  for nausea - Bentyl  for diarrhea and abdominal cramping - Push fluids, electrolyte beverages - Liquid diet, then increase to soft/bland (BRAT) diet over next day, then increase diet as tolerated - Seek in person evaluation if not improving or symptoms worsen   Follow Up Instructions: I discussed the assessment and treatment plan with the patient. The patient was provided an opportunity to ask questions and all were answered. The patient agreed with the plan and demonstrated an understanding of the instructions.  A copy of instructions were sent to the patient via MyChart unless otherwise noted below.    The patient was advised to call back or seek an in-person evaluation if the symptoms worsen or if the condition fails to improve as anticipated.    Shannon CHRISTELLA Dickinson, PA-C
